# Patient Record
Sex: Female | Born: 1970 | Race: White | Hispanic: No | Marital: Single | Smoking: Former smoker
Health system: Southern US, Community
[De-identification: ages and names within clinical notes are randomized; demographics above are authoritative.]

## PROBLEM LIST (undated history)

## (undated) DIAGNOSIS — I10 Essential (primary) hypertension: Secondary | ICD-10-CM

## (undated) DIAGNOSIS — G2581 Restless legs syndrome: Secondary | ICD-10-CM

## (undated) DIAGNOSIS — T7840XA Allergy, unspecified, initial encounter: Secondary | ICD-10-CM

## (undated) DIAGNOSIS — K219 Gastro-esophageal reflux disease without esophagitis: Secondary | ICD-10-CM

## (undated) DIAGNOSIS — Z8489 Family history of other specified conditions: Secondary | ICD-10-CM

## (undated) DIAGNOSIS — F419 Anxiety disorder, unspecified: Secondary | ICD-10-CM

## (undated) DIAGNOSIS — G43909 Migraine, unspecified, not intractable, without status migrainosus: Secondary | ICD-10-CM

## (undated) DIAGNOSIS — E785 Hyperlipidemia, unspecified: Secondary | ICD-10-CM

## (undated) HISTORY — DX: Allergy, unspecified, initial encounter: T78.40XA

## (undated) HISTORY — DX: Hyperlipidemia, unspecified: E78.5

## (undated) HISTORY — PX: TONSILLECTOMY: SUR1361

## (undated) HISTORY — DX: Gastro-esophageal reflux disease without esophagitis: K21.9

## (undated) HISTORY — PX: TUBAL LIGATION: SHX77

## (undated) HISTORY — PX: HERNIA REPAIR: SHX51

## (undated) HISTORY — DX: Restless legs syndrome: G25.81

## (undated) HISTORY — DX: Anxiety disorder, unspecified: F41.9

---

## 2004-03-18 ENCOUNTER — Emergency Department: Payer: Self-pay | Admitting: Emergency Medicine

## 2004-07-16 ENCOUNTER — Emergency Department: Payer: Self-pay | Admitting: Emergency Medicine

## 2006-08-09 ENCOUNTER — Emergency Department: Payer: Self-pay | Admitting: Emergency Medicine

## 2007-02-27 ENCOUNTER — Other Ambulatory Visit: Payer: Self-pay

## 2007-02-27 ENCOUNTER — Emergency Department: Payer: Self-pay | Admitting: Internal Medicine

## 2007-03-12 ENCOUNTER — Emergency Department: Payer: Self-pay | Admitting: Emergency Medicine

## 2007-06-03 ENCOUNTER — Emergency Department: Payer: Self-pay | Admitting: Emergency Medicine

## 2007-09-05 ENCOUNTER — Other Ambulatory Visit: Payer: Self-pay

## 2007-09-05 ENCOUNTER — Emergency Department: Payer: Self-pay | Admitting: Emergency Medicine

## 2008-08-29 IMAGING — CR RIGHT HAND - COMPLETE 3+ VIEW
1 series · 3 of 3 positions shown · non-contrast
Comparison: none

REASON FOR EXAM: trauma
COMMENTS:

[Series 1: view not recorded · 0.17mm/px · 3 of 3 slices shown]
[im 1/3]
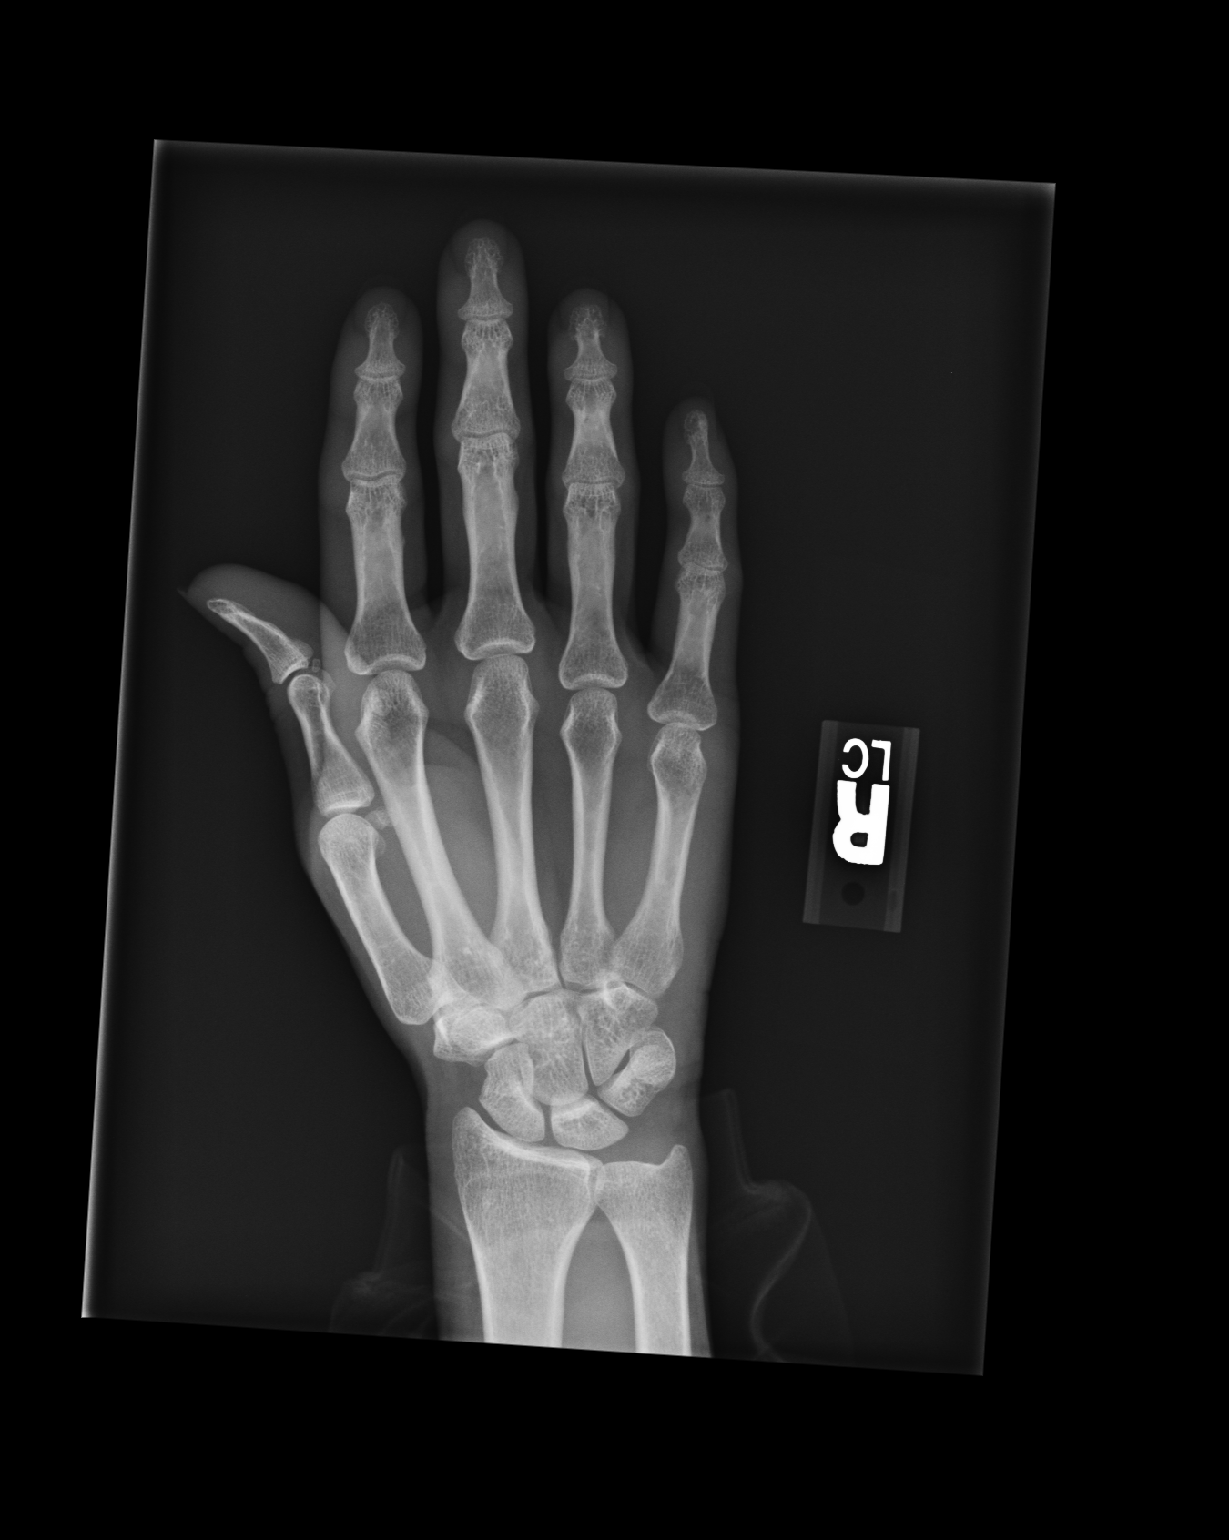
[im 2/3]
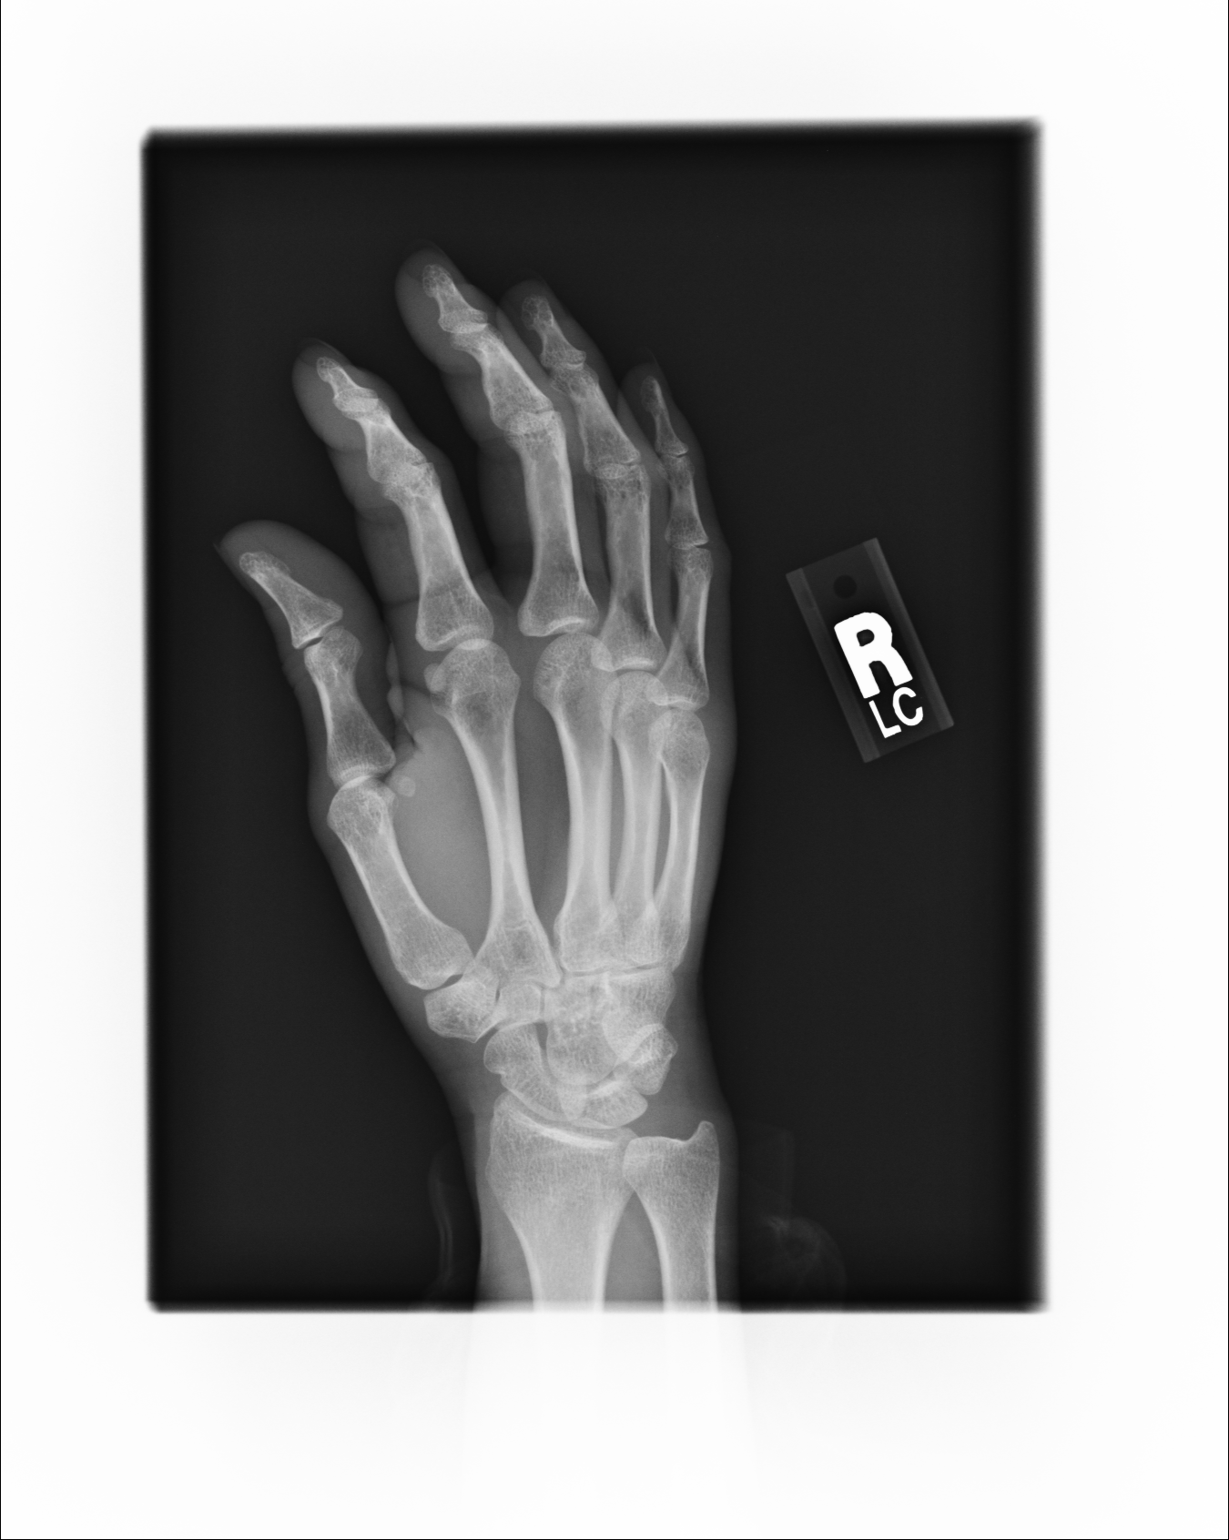
[im 3/3]
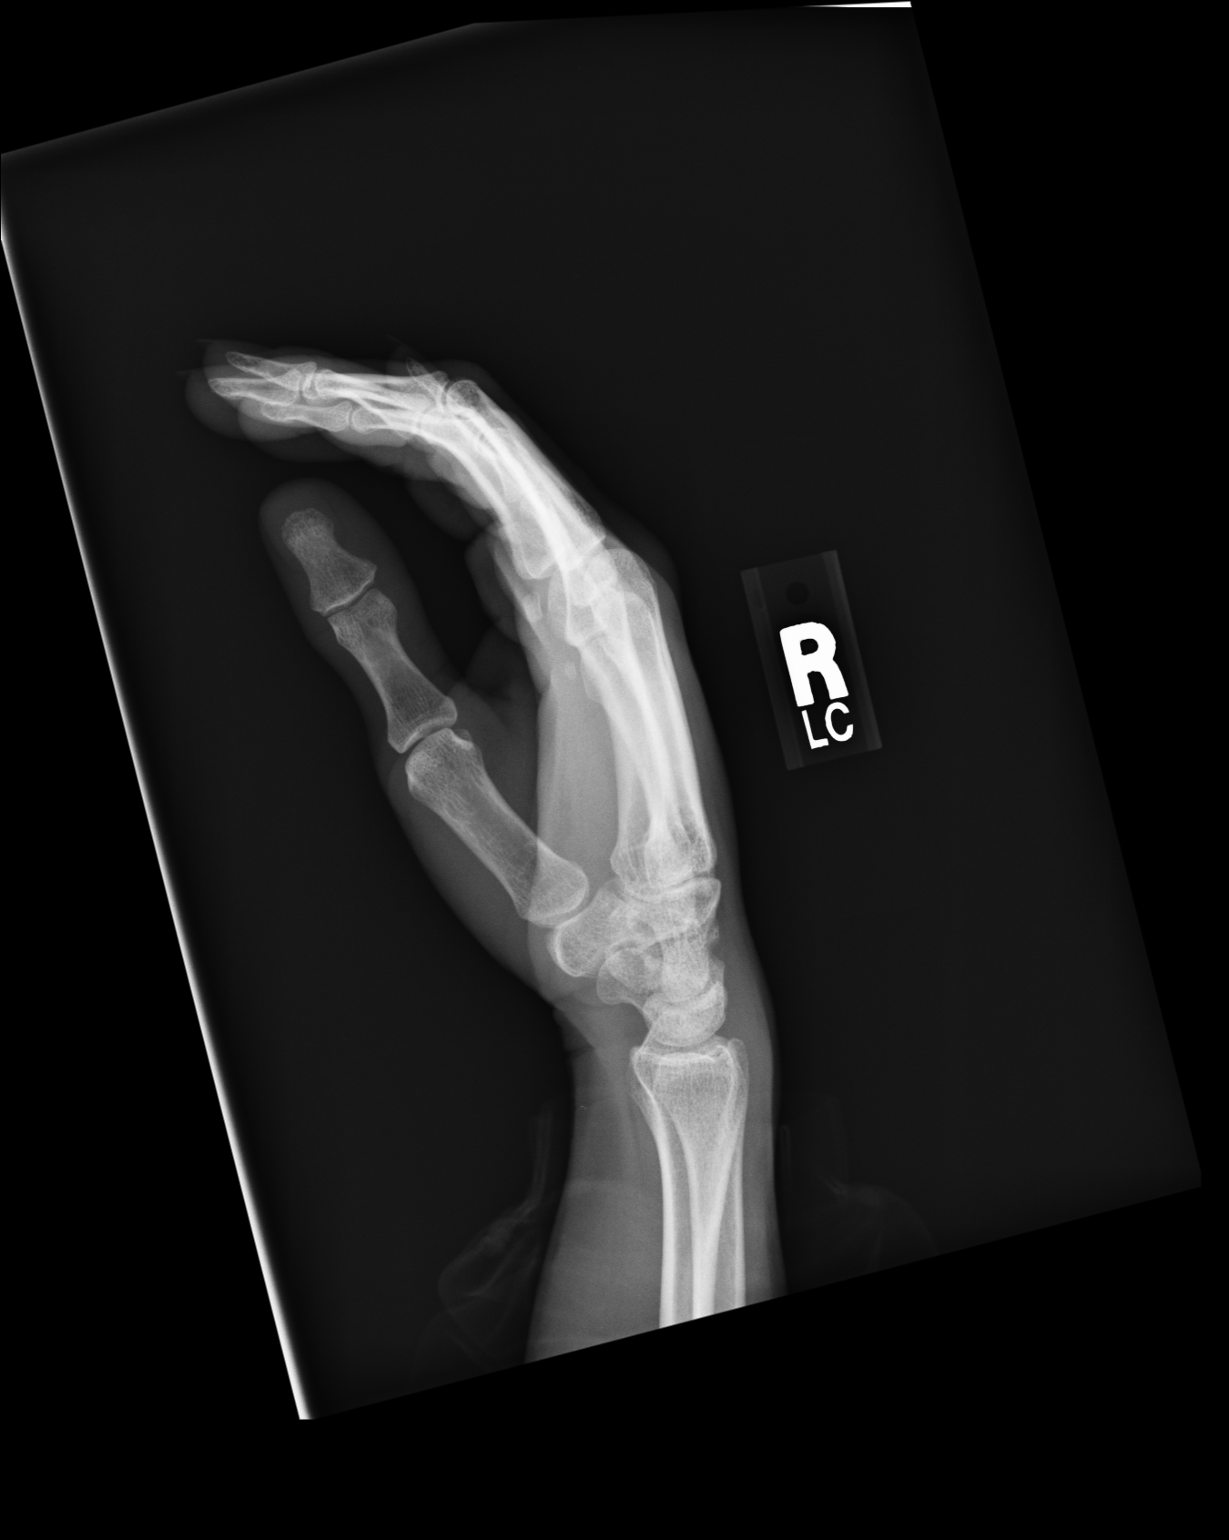

[3 of 3 positions shown; findings below may reference images not displayed]

PROCEDURE:     DXR - DXR HAND RT COMPLETE W/OBLIQUES  - March 12, 2007  [DATE]

RESULT:     The bones of the hand appear adequately mineralized. There is no
evidence of an acute displaced fracture. Clinical note was made of
abnormality suspected in the region of the fourth metacarpophalangeal joint.
I do not see definite evidence of acute bony abnormality here. There may be
overlying soft tissue swelling.
IMPRESSION: I do not see objective evidence of an acute fracture of the
bones of the RIGHT hand. Specific attention to the fourth
metacarpophalangeal joint does not reveal evidence of an acute displaced
fracture. Follow-up imaging is available if the patient has persistent
symptoms.

## 2009-09-03 ENCOUNTER — Emergency Department: Payer: Self-pay | Admitting: Unknown Physician Specialty

## 2009-12-22 ENCOUNTER — Emergency Department: Payer: Self-pay | Admitting: Emergency Medicine

## 2010-04-15 ENCOUNTER — Ambulatory Visit: Payer: Self-pay | Admitting: Family Medicine

## 2011-12-17 ENCOUNTER — Ambulatory Visit: Payer: Self-pay | Admitting: Family Medicine

## 2012-01-28 ENCOUNTER — Ambulatory Visit: Payer: Self-pay | Admitting: Family Medicine

## 2012-01-28 LAB — CBC WITH DIFFERENTIAL/PLATELET
Basophil %: 1.4 %
Eosinophil %: 3.4 %
HGB: 14.1 g/dL (ref 12.0–16.0)
Lymphocyte %: 31.2 %
Monocyte %: 8.1 %
Neutrophil %: 55.9 %
RBC: 4.79 10*6/uL (ref 3.80–5.20)
WBC: 5.3 10*3/uL (ref 3.6–11.0)

## 2012-08-30 ENCOUNTER — Emergency Department: Payer: Self-pay | Admitting: Emergency Medicine

## 2012-12-10 ENCOUNTER — Emergency Department: Payer: Self-pay | Admitting: Emergency Medicine

## 2014-11-15 DIAGNOSIS — G43009 Migraine without aura, not intractable, without status migrainosus: Secondary | ICD-10-CM | POA: Insufficient documentation

## 2015-07-19 DIAGNOSIS — F410 Panic disorder [episodic paroxysmal anxiety] without agoraphobia: Secondary | ICD-10-CM

## 2015-07-19 HISTORY — DX: Panic disorder (episodic paroxysmal anxiety): F41.0

## 2015-07-31 ENCOUNTER — Other Ambulatory Visit: Payer: Self-pay | Admitting: Family Medicine

## 2015-07-31 DIAGNOSIS — Z1231 Encounter for screening mammogram for malignant neoplasm of breast: Secondary | ICD-10-CM

## 2015-08-02 ENCOUNTER — Ambulatory Visit
Admission: RE | Admit: 2015-08-02 | Discharge: 2015-08-02 | Disposition: A | Discharge status: Home / Self Care | Payer: BLUE CROSS/BLUE SHIELD | Source: Ambulatory Visit | Attending: Family Medicine | Admitting: Family Medicine

## 2015-08-02 DIAGNOSIS — Z1231 Encounter for screening mammogram for malignant neoplasm of breast: Secondary | ICD-10-CM | POA: Diagnosis not present

## 2015-08-14 ENCOUNTER — Other Ambulatory Visit: Payer: Self-pay | Admitting: Family Medicine

## 2015-08-14 DIAGNOSIS — R928 Other abnormal and inconclusive findings on diagnostic imaging of breast: Secondary | ICD-10-CM

## 2015-08-17 ENCOUNTER — Ambulatory Visit
Admission: RE | Admit: 2015-08-17 | Discharge: 2015-08-17 | Disposition: A | Discharge status: Home / Self Care | Payer: BLUE CROSS/BLUE SHIELD | Source: Ambulatory Visit | Attending: Family Medicine | Admitting: Family Medicine

## 2015-08-17 DIAGNOSIS — N6002 Solitary cyst of left breast: Secondary | ICD-10-CM | POA: Insufficient documentation

## 2015-08-17 DIAGNOSIS — R928 Other abnormal and inconclusive findings on diagnostic imaging of breast: Secondary | ICD-10-CM

## 2015-08-20 LAB — HM PAP SMEAR

## 2016-08-22 ENCOUNTER — Ambulatory Visit (INDEPENDENT_AMBULATORY_CARE_PROVIDER_SITE_OTHER): Payer: BLUE CROSS/BLUE SHIELD

## 2016-08-22 ENCOUNTER — Encounter: Payer: Self-pay | Admitting: *Deleted

## 2016-08-22 ENCOUNTER — Ambulatory Visit
Admission: EM | Admit: 2016-08-22 | Discharge: 2016-08-22 | Disposition: A | Discharge status: Home / Self Care | Payer: BLUE CROSS/BLUE SHIELD | Attending: Emergency Medicine | Admitting: Emergency Medicine

## 2016-08-22 DIAGNOSIS — R05 Cough: Secondary | ICD-10-CM

## 2016-08-22 DIAGNOSIS — R059 Cough, unspecified: Secondary | ICD-10-CM

## 2016-08-22 LAB — BRAIN NATRIURETIC PEPTIDE: B NATRIURETIC PEPTIDE 5: 7 pg/mL (ref 0.0–100.0)

## 2016-08-22 LAB — BASIC METABOLIC PANEL
ANION GAP: 7 (ref 5–15)
BUN: 13 mg/dL (ref 6–20)
CALCIUM: 9.1 mg/dL (ref 8.9–10.3)
CO2: 23 mmol/L (ref 22–32)
Chloride: 104 mmol/L (ref 101–111)
Creatinine, Ser: 0.57 mg/dL (ref 0.44–1.00)
GLUCOSE: 102 mg/dL — AB (ref 65–99)
Potassium: 3.9 mmol/L (ref 3.5–5.1)
Sodium: 134 mmol/L — ABNORMAL LOW (ref 135–145)

## 2016-08-22 MED ORDER — BENZONATATE 200 MG PO CAPS: 200.0000 mg | ORAL_CAPSULE | Freq: Three times a day (TID) | ORAL | 0 refills | Status: DC | PRN

## 2016-08-22 MED ORDER — AEROCHAMBER PLUS FLO-VU MEDIUM MISC
1.0000 | Freq: Once | Status: AC
  Administered 2016-08-22: 1

## 2016-08-22 NOTE — Discharge Instructions (Signed)
2 puffs from your albuterol inhaler every 4-6 hours using your spacer. Tessalon as needed for cough. Give The antibiotics and steroids a few more days to work. Follow-up with her primary care physician if not getting better in several days, go to the ER for the signs and symptoms we discussed.

## 2016-08-22 NOTE — ED Triage Notes (Signed)
Patient started having symptom of productive cough, and nasal congestion 1.5 months ago. SOB on exertion.

## 2016-08-22 NOTE — ED Provider Notes (Signed)
HPI  SUBJECTIVE:  Cristina Aguilar is a 46 y.o. female who presents with "bronchitis" for the past month and a half. She reports coughing, wheezing, shortness breath, dyspnea on exertion to several hundred feet. She states that she can't sleep at night secondary to the cough. She reports chest soreness from the cough but denies chest pain. She was seen at another urgent care, given azithromycin and then switched to 10-15 days of Levaquin which she states that she finished yesterday. She also states that she finished a prednisone taper. States that she was using her albuterol inhaler 4-5 times a day without improvement in her symptoms. She does not have a spacer. She has also tried over-the-counter cough syrup, cough drops and allergy medications without improvement in her symptoms. No alleviating factors. Symptoms are worse with movement and talking. She denies posttussive emesis, fevers, unintentional weight loss. No nasal congestion, rhinorrhea, postnasal drip, sinus pain or pressure. No unintentional weight gain, lower extremity edema, nocturia, PND, abdominal pain. She does report 2 pillow orthopnea for the during this time. No GERD for allergy type symptoms. No antipyretic in the past 6-8 hours. She has a 7 1/2 year history of smoking and quit. She has a past medical history sinusitis. No history of asthma, emphysema, COPD, CHF, diabetes, hypertension, GERD. LMP: 5/29. Denies possibility of being pregnant. PMD: [facility].   History reviewed. No pertinent past medical history.  Past Surgical History:  Procedure Laterality Date  . TONSILLECTOMY    . TUBAL LIGATION      Family History  Problem Relation Age of Onset  . Breast cancer Maternal Grandmother        mat great gm    Social History  Substance Use Topics  . Smoking status: Former Smoker    Quit date: 08/23/2006  . Smokeless tobacco: Never Used  . Alcohol use No    No current facility-administered medications for this  encounter.   Current Outpatient Prescriptions:  .  LevoFLOXacin (LEVAQUIN PO), Take by mouth., Disp: , Rfl:  .  predniSONE (STERAPRED UNI-PAK 21 TAB) 10 MG (21) TBPK tablet, Take 10 mg by mouth daily., Disp: , Rfl:  .  benzonatate (TESSALON) 200 MG capsule, Take 1 capsule (200 mg total) by mouth 3 (three) times daily as needed for cough., Disp: 30 capsule, Rfl: 0  Allergies  Allergen Reactions  . Codeine Shortness Of Breath  . Morphine And Related Other (See Comments)    Seizures   . Sulfa Antibiotics Rash     ROS  As noted in HPI.   Physical Exam  BP 122/85 (BP Location: Left Arm)   Pulse 62   Temp 98.1 F (36.7 C) (Oral)   Resp 16   Ht 5\' 1"  (1.549 m)   Wt 123 lb (55.8 kg)   LMP 08/05/2016   SpO2 98%   BMI 23.24 kg/m   Constitutional: Well developed, well nourished, no acute distress Eyes: PERRL, EOMI, conjunctiva normal bilaterally HENT: Normocephalic, atraumatic,mucus membranes moist. No nasal congestion, sinus tenderness. Tonsils surgically absent. No obvious postnasal drip Respiratory: Clear to auscultation bilaterally, no rales, no wheezing, no rhonchi no chest wall tenderness Cardiovascular: Normal rate and rhythm, no murmurs, no gallops, no rubs. No JVD GI: nondistended skin: No rash, skin intact Musculoskeletal: Calves symmetric, No edema, no tenderness, no deformities Neurologic: Alert & oriented x 3, CN II-XII grossly intact, no motor deficits, sensation grossly intact Psychiatric: Speech and behavior appropriate   ED Course   Medications  AEROCHAMBER  PLUS FLO-VU MEDIUM MISC 1 each (1 each Other Given 08/22/16 1243)    Orders Placed This Encounter  Procedures  . DG Chest 2 View    Standing Status:   Standing    Number of Occurrences:   1    Order Specific Question:   Reason for Exam (SYMPTOM  OR DIAGNOSIS REQUIRED)    Answer:   r/o PNA, CHF, effusion, CA  . Brain natriuretic peptide    Standing Status:   Standing    Number of Occurrences:   1   . Basic metabolic panel    Standing Status:   Standing    Number of Occurrences:   1   Results for orders placed or performed during the hospital encounter of 08/22/16 (from the past 24 hour(s))  Brain natriuretic peptide     Status: None   Collection Time: 08/22/16 12:29 PM  Result Value Ref Range   B Natriuretic Peptide 7.0 0.0 - 100.0 pg/mL  Basic metabolic panel     Status: Abnormal   Collection Time: 08/22/16 12:29 PM  Result Value Ref Range   Sodium 134 (L) 135 - 145 mmol/L   Potassium 3.9 3.5 - 5.1 mmol/L   Chloride 104 101 - 111 mmol/L   CO2 23 22 - 32 mmol/L   Glucose, Bld 102 (H) 65 - 99 mg/dL   BUN 13 6 - 20 mg/dL   Creatinine, Ser 1.610.57 0.44 - 1.00 mg/dL   Calcium 9.1 8.9 - 09.610.3 mg/dL   GFR calc non Af Amer >60 >60 mL/min   GFR calc Af Amer >60 >60 mL/min   Anion gap 7 5 - 15   Dg Chest 2 View  Result Date: 08/22/2016 CLINICAL DATA:  Several months of cough, 1 month of shortness of breath. EXAM: CHEST  2 VIEW COMPARISON:  Chest x-ray of January 28, 2012 FINDINGS: The lungs are well-expanded and clear. The heart and pulmonary vascularity are normal. The mediastinum is normal in width. There is no pleural effusion. The bony thorax is unremarkable. IMPRESSION: There is no pneumonia, CHF, nor other acute cardiopulmonary abnormality. Electronically Signed   By: David  SwazilandJordan M.D.   On: 08/22/2016 11:58    ED Clinical Impression  Cough   ED Assessment/Plan  Attempted to review care everywhere notes, unable to see notes, the patient was seen in November 2017 for cough and shortness of breath by her PMD.  We'll repeat chest x-ray, and give patient a spacer. We'll also check a BNP. Home with Tessalon as she reports significant allergies to hydrocodone, morphine and codeine.   Chest x-ray independently reviewed. No effusion, pneumonia, CHF or other acute cardiopulmonary abnormality. See radiology report for details.  BNP negative. BMP normal.   no evidence of  sinusitis. She is not on any Ace inhibitors.   GERD in the differential. We'll have the patient continue albuterol and a regular basis, states that she does not need a prescription for another inhaler, but using her spacer. Advised her to give this several more days. She will need follow-up with her primary care physician if not getting better. To the ER if she gets worse. Discussed labs, imaging, MDM, plan and followup with patient. Discussed sn/sx that should prompt return to the ED. Patient agrees with plan.   Meds ordered this encounter  Medications  . LevoFLOXacin (LEVAQUIN PO)    Sig: Take by mouth.  . predniSONE (STERAPRED UNI-PAK 21 TAB) 10 MG (21) TBPK tablet    Sig: Take 10  mg by mouth daily.  . AEROCHAMBER PLUS FLO-VU MEDIUM MISC 1 each  . benzonatate (TESSALON) 200 MG capsule    Sig: Take 1 capsule (200 mg total) by mouth 3 (three) times daily as needed for cough.    Dispense:  30 capsule    Refill:  0    *This clinic note was created using Scientist, clinical (histocompatibility and immunogenetics). Therefore, there may be occasional mistakes despite careful proofreading.  ?   Domenick Gong, MD 08/22/16 [phone]

## 2017-02-05 ENCOUNTER — Emergency Department (HOSPITAL_COMMUNITY): Payer: BLUE CROSS/BLUE SHIELD

## 2017-02-05 ENCOUNTER — Emergency Department (HOSPITAL_COMMUNITY)
Admission: EM | Admit: 2017-02-05 | Discharge: 2017-02-06 | Disposition: A | Discharge status: Home / Self Care | Payer: BLUE CROSS/BLUE SHIELD | Attending: Emergency Medicine | Admitting: Emergency Medicine

## 2017-02-05 ENCOUNTER — Other Ambulatory Visit: Payer: Self-pay

## 2017-02-05 ENCOUNTER — Encounter (HOSPITAL_COMMUNITY): Payer: Self-pay | Admitting: Emergency Medicine

## 2017-02-05 DIAGNOSIS — R079 Chest pain, unspecified: Secondary | ICD-10-CM | POA: Diagnosis present

## 2017-02-05 DIAGNOSIS — M549 Dorsalgia, unspecified: Secondary | ICD-10-CM | POA: Insufficient documentation

## 2017-02-05 DIAGNOSIS — Z87891 Personal history of nicotine dependence: Secondary | ICD-10-CM | POA: Diagnosis not present

## 2017-02-05 DIAGNOSIS — R0789 Other chest pain: Secondary | ICD-10-CM

## 2017-02-05 LAB — BASIC METABOLIC PANEL
Anion gap: 6 (ref 5–15)
BUN: 14 mg/dL (ref 6–20)
CALCIUM: 9.2 mg/dL (ref 8.9–10.3)
CO2: 24 mmol/L (ref 22–32)
Chloride: 107 mmol/L (ref 101–111)
Creatinine, Ser: 0.6 mg/dL (ref 0.44–1.00)
GFR calc Af Amer: >60 mL/min (ref >60)
GLUCOSE: 111 mg/dL — AB (ref 65–99)
POTASSIUM: 3.3 mmol/L — AB (ref 3.5–5.1)
Sodium: 137 mmol/L (ref 135–145)

## 2017-02-05 LAB — CBC
HEMATOCRIT: 41.5 % (ref 36.0–46.0)
Hemoglobin: 13.6 g/dL (ref 12.0–15.0)
MCH: 28.8 pg (ref 26.0–34.0)
MCHC: 32.8 g/dL (ref 30.0–36.0)
MCV: 87.7 fL (ref 78.0–100.0)
Platelets: 241 10*3/uL (ref 150–400)
RBC: 4.73 MIL/uL (ref 3.87–5.11)
RDW: 13.2 % (ref 11.5–15.5)
WBC: 6.8 10*3/uL (ref 4.0–10.5)

## 2017-02-05 LAB — I-STAT TROPONIN, ED: Troponin i, poc: 0 ng/mL (ref 0.00–0.08)

## 2017-02-05 MED ORDER — GI COCKTAIL ~~LOC~~
30.0000 mL | Freq: Once | ORAL | Status: AC
  Administered 2017-02-05: 30 mL via ORAL
  Filled 2017-02-05: qty 30

## 2017-02-05 NOTE — ED Notes (Addendum)
I-Stat Troponin: 0.00 

## 2017-02-05 NOTE — ED Triage Notes (Signed)
Cp in center of chest on and off since last night.  Has been worse tonight

## 2017-02-06 LAB — HEPATIC FUNCTION PANEL
ALBUMIN: 4.1 g/dL (ref 3.5–5.0)
ALT: 20 U/L (ref 14–54)
AST: 21 U/L (ref 15–41)
Alkaline Phosphatase: 58 U/L (ref 38–126)
Bilirubin, Direct: <0.1 mg/dL — ABNORMAL LOW (ref 0.1–0.5)
TOTAL PROTEIN: 6.9 g/dL (ref 6.5–8.1)
Total Bilirubin: 0.5 mg/dL (ref 0.3–1.2)

## 2017-02-06 LAB — LIPASE, BLOOD: LIPASE: 46 U/L (ref 11–51)

## 2017-02-06 MED ORDER — PANTOPRAZOLE SODIUM 20 MG PO TBEC: 20.0000 mg | DELAYED_RELEASE_TABLET | Freq: Two times a day (BID) | ORAL | 0 refills | Status: DC

## 2017-02-06 MED ORDER — NAPROXEN 375 MG PO TABS: 375.0000 mg | ORAL_TABLET | Freq: Two times a day (BID) | ORAL | 0 refills | Status: DC

## 2017-02-06 NOTE — Discharge Instructions (Signed)
Your exam, labs, ekg and chest xray are negative tonight with no sign of your symptoms being from your heart or lungs. The location and radiation into your back and the timing of your symptoms suggests possible esophageal spasms, or chest wall pain source (ribcage pain).  Given you obtained some relief with the GI cocktail we gave you, you have been prescribed an antacid medicine and an anti inflammatory to see if your symptoms will resolve completely. Plan a recheck by your doctor in one week for a recheck.

## 2017-02-06 NOTE — ED Provider Notes (Signed)
[facility] EMERGENCY DEPARTMENT Provider Note   CSN: [id number] Arrival date & time: 02/05/17  2225     History   Chief Complaint Chief Complaint  Patient presents with  . Chest Pain    HPI Cristina Aguilar is a 46 y.o. female with no significant past medical history and no family history of significant coronary disease presenting with a 24 hour history of intermittent sharp, stabbing pain in her left upper chest, lasting up to one minute, multiple episodes throughout the day.  She reports episodes of radiation of pain into her left upper back but also episodes in which she only feels it in the back as well.  She denies sob, n/v/ abdominal pain, palpitations, cough or recent respiratory illness and denies dizziness or diaphoresis.  She took a dose of ibuprofen last night without improvement. Denies any recent long car rides/plane trips or other periods of sedation, stating she is very active, works in a Retail buyer setting position, denies any obvious triggers for these episodes including exertion, eating or positional changes.  The history is provided by the patient.    History reviewed. No pertinent past medical history.  There are no active problems to display for this patient.   Past Surgical History:  Procedure Laterality Date  . TONSILLECTOMY    . TUBAL LIGATION      OB History    No data available       Home Medications    Prior to Admission medications   Medication Sig Start Date End Date Taking? Authorizing Provider  benzonatate (TESSALON) 200 MG capsule Take 1 capsule (200 mg total) by mouth 3 (three) times daily as needed for cough. 08/22/16   Domenick Gong, MD  LevoFLOXacin (LEVAQUIN PO) Take by mouth.    [provider]  naproxen (NAPROSYN) 375 MG tablet Take 1 tablet (375 mg total) by mouth 2 (two) times daily. 02/06/17   Burgess Amor, PA-C  pantoprazole (PROTONIX) 20 MG tablet Take 1 tablet (20 mg total) by mouth 2 (two) times daily.  02/06/17   Burgess Amor, PA-C  predniSONE (STERAPRED UNI-PAK 21 TAB) 10 MG (21) TBPK tablet Take 10 mg by mouth daily.    [provider]    Family History Family History  Problem Relation Age of Onset  . Breast cancer Maternal Grandmother        mat great gm    Social History Social History   Tobacco Use  . Smoking status: Former Smoker    Last attempt to quit: 08/23/2006    Years since quitting: 10.4  . Smokeless tobacco: Never Used  Substance Use Topics  . Alcohol use: No  . Drug use: Not on file     Allergies   Codeine; Morphine and related; and Sulfa antibiotics   Review of Systems Review of Systems  Constitutional: Negative for fever.  HENT: Negative for congestion and sore throat.   Eyes: Negative.   Respiratory: Negative for cough, chest tightness, shortness of breath, wheezing and stridor.   Cardiovascular: Positive for chest pain. Negative for palpitations and leg swelling.  Gastrointestinal: Negative for abdominal pain, nausea and vomiting.  Genitourinary: Negative.   Musculoskeletal: Negative for arthralgias, joint swelling and neck pain.  Skin: Negative.  Negative for rash and wound.  Neurological: Negative for dizziness, weakness, light-headedness, numbness and headaches.  Psychiatric/Behavioral: Negative.      Physical Exam Updated Vital Signs BP 140/65   Pulse (!) 56   Temp 98 F (36.7 C) (Oral)  Resp 18   Ht 5\' 1"  (1.549 m)   Wt 60.3 kg (133 lb)   LMP 01/17/2017   SpO2 99%   BMI 25.13 kg/m   Physical Exam  Constitutional: She appears well-developed and well-nourished.  HENT:  Head: Normocephalic and atraumatic.  Eyes: Conjunctivae are normal.  Neck: Normal range of motion.  Cardiovascular: Normal rate, regular rhythm, normal heart sounds and intact distal pulses. Exam reveals no friction rub.  No murmur heard.  No systolic murmur is present. Pulses:      Carotid pulses are 2+ on the right side, and 2+ on the left side.       Radial pulses are 2+ on the right side, and 2+ on the left side.       Dorsalis pedis pulses are 2+ on the right side, and 2+ on the left side.  Pulmonary/Chest: Effort normal and breath sounds normal. She has no decreased breath sounds. She has no wheezes. She has no rhonchi. She has no rales.  Abdominal: Soft. Bowel sounds are normal. There is no tenderness.  Musculoskeletal: Normal range of motion.       Right lower leg: Normal. She exhibits no tenderness and no edema.       Left lower leg: Normal. She exhibits no tenderness and no edema.  Neurological: She is alert.  Skin: Skin is warm and dry.  Psychiatric: She has a normal mood and affect.  Nursing note and vitals reviewed.    ED Treatments / Results  Labs (all labs ordered are listed, but only abnormal results are displayed) Labs Reviewed  BASIC METABOLIC PANEL - Abnormal; Notable for the following components:      Result Value   Potassium 3.3 (*)    Glucose, Bld 111 (*)    All other components within normal limits  HEPATIC FUNCTION PANEL - Abnormal; Notable for the following components:   Bilirubin, Direct <0.1 (*)    All other components within normal limits  CBC  LIPASE, BLOOD  I-STAT TROPONIN, ED    EKG  EKG Interpretation  Date/Time:  Thursday February 05 2017 22:35:17 EST Ventricular Rate:  66 PR Interval:    QRS Duration: 83 QT Interval:  386 QTC Calculation: 405 R Axis:   74 Text Interpretation:  Sinus rhythm Normal ECG No significant change since last tracing 05 Sep 2007 Confirmed by Devoria AlbeKnapp, Iva ([id number]) on 02/06/2017 2:43:07 AM       Radiology Dg Chest 2 View  Result Date: 02/05/2017 CLINICAL DATA:  Left-sided chest pain, onset last night. EXAM: CHEST  2 VIEW COMPARISON:  08/22/2016 FINDINGS: The cardiomediastinal contours are normal. Minimal right apical scarring, unchanged. Pulmonary vasculature is normal. No consolidation, pleural effusion, or pneumothorax. No acute osseous abnormalities are seen.  IMPRESSION: No acute pulmonary process. Electronically Signed   By: Rubye OaksMelanie  Ehinger M.D.   On: 02/05/2017 23:13    Procedures Procedures (including critical care time)  Medications Ordered in ED Medications  gi cocktail (Maalox,Lidocaine,Donnatal) (30 mLs Oral Given 02/05/17 2349)     Initial Impression / Assessment and Plan / ED Course  I have reviewed the triage vital signs and the nursing notes.  Pertinent labs & imaging results that were available during my care of the patient were reviewed by me and considered in my medical decision making (see chart for details).     Pt with no personal or family risk factors for CAD with atypical transient episodes of chest and back pain. Troponin x 1 neg 24 hours  out from initiation of sx. No risk factors for PE, no sob, no tachypnea, tachycardia. Pt meets no Wells criteria for risk of PE.  She was given a GI cocktail and endorsed her anterior chest pain episodes resolved after this med, has had a few episodes of back pain but less intense after this tx.  Suspect GERD and/or esophageal spasm as source of sx.  PPI, anti inflammatory prescribed to cover for possible chest wall pain as well. Advised f/u with pcp for a recheck within a week, sooner for any worsened sx.  Final Clinical Impressions(s) / ED Diagnoses   Final diagnoses:  Atypical chest pain    ED Discharge Orders        Ordered    pantoprazole (PROTONIX) 20 MG tablet  2 times daily     02/06/17 0148    naproxen (NAPROSYN) 375 MG tablet  2 times daily     02/06/17 0148       Burgess Amordol, Doranne Schmutz, PA-C 02/06/17 1329    Devoria AlbeKnapp, Iva, MD 02/06/17 2258

## 2017-02-26 ENCOUNTER — Other Ambulatory Visit: Payer: Self-pay | Admitting: Family Medicine

## 2017-02-26 DIAGNOSIS — Z1231 Encounter for screening mammogram for malignant neoplasm of breast: Secondary | ICD-10-CM

## 2017-10-02 ENCOUNTER — Ambulatory Visit
Admission: RE | Admit: 2017-10-02 | Discharge: 2017-10-02 | Disposition: A | Discharge status: Home / Self Care | Payer: BLUE CROSS/BLUE SHIELD | Source: Ambulatory Visit | Attending: Family Medicine | Admitting: Family Medicine

## 2017-10-02 DIAGNOSIS — Z1231 Encounter for screening mammogram for malignant neoplasm of breast: Secondary | ICD-10-CM | POA: Diagnosis present

## 2018-08-30 ENCOUNTER — Other Ambulatory Visit: Payer: Self-pay | Admitting: Neurology

## 2018-08-30 DIAGNOSIS — R42 Dizziness and giddiness: Secondary | ICD-10-CM

## 2018-09-15 ENCOUNTER — Other Ambulatory Visit: Payer: Self-pay

## 2018-09-15 ENCOUNTER — Ambulatory Visit
Admission: RE | Admit: 2018-09-15 | Discharge: 2018-09-15 | Disposition: A | Discharge status: Home / Self Care | Payer: BC Managed Care – PPO | Source: Ambulatory Visit | Attending: Neurology | Admitting: Neurology

## 2018-09-15 DIAGNOSIS — R42 Dizziness and giddiness: Secondary | ICD-10-CM | POA: Insufficient documentation

## 2018-09-15 MED ORDER — GADOBUTROL 1 MMOL/ML IV SOLN
6.0000 mL | Freq: Once | INTRAVENOUS | Status: AC | PRN
  Administered 2018-09-15: 17:00:00 6 mL via INTRAVENOUS

## 2019-09-08 LAB — TSH: TSH: 1.98 (ref 0.41–5.90)

## 2019-09-08 LAB — HM HEPATITIS C SCREENING LAB: HM Hepatitis Screen: NEGATIVE

## 2019-09-14 ENCOUNTER — Other Ambulatory Visit: Payer: Self-pay | Admitting: Medical Oncology

## 2019-09-14 DIAGNOSIS — Z1231 Encounter for screening mammogram for malignant neoplasm of breast: Secondary | ICD-10-CM

## 2019-09-22 ENCOUNTER — Inpatient Hospital Stay: Admission: RE | Admit: 2019-09-22 | Payer: BC Managed Care – PPO | Source: Ambulatory Visit

## 2020-01-12 ENCOUNTER — Encounter (HOSPITAL_COMMUNITY): Payer: Self-pay | Admitting: Emergency Medicine

## 2020-01-12 ENCOUNTER — Emergency Department (HOSPITAL_COMMUNITY): Payer: BC Managed Care – PPO

## 2020-01-12 ENCOUNTER — Other Ambulatory Visit: Payer: Self-pay

## 2020-01-12 ENCOUNTER — Emergency Department (HOSPITAL_COMMUNITY)
Admission: EM | Admit: 2020-01-12 | Discharge: 2020-01-12 | Disposition: A | Discharge status: Home / Self Care | Payer: BC Managed Care – PPO | Attending: Emergency Medicine | Admitting: Emergency Medicine

## 2020-01-12 DIAGNOSIS — Z9851 Tubal ligation status: Secondary | ICD-10-CM | POA: Diagnosis not present

## 2020-01-12 DIAGNOSIS — M79662 Pain in left lower leg: Secondary | ICD-10-CM

## 2020-01-12 DIAGNOSIS — Z87891 Personal history of nicotine dependence: Secondary | ICD-10-CM | POA: Insufficient documentation

## 2020-01-12 NOTE — ED Provider Notes (Signed)
[facility] EMERGENCY DEPARTMENT Provider Note   CSN: [id number] Arrival date & time: 01/12/20  1506     History Chief Complaint  Patient presents with  . Leg Pain    Cristina Aguilar is a 49 y.o. female who presents for evaluation of left lower extremity pain that began last night while at work.  Patient reports she was at work when she started noticing some pain in her left calf.  She denies any preceding trauma, injury, fall.  She has been able to ambulate on it.  She reports that today, she went to urgent care and was sent to the ED for further evaluation of possible DVT.  Patient states she has not noted any redness or swelling of the leg. Her pain has actually since resolved and today states that over the last few hours she has not had any pain.  She has not any numbness/weakness of her extremities. She denies any OCP use, recent immobilization, prior history of DVT/PE, recent surgery, leg swelling, or long travel.  The history is provided by the patient.       History reviewed. No pertinent past medical history.  There are no problems to display for this patient.   Past Surgical History:  Procedure Laterality Date  . TONSILLECTOMY    . TUBAL LIGATION       OB History   No obstetric history on file.     Family History  Problem Relation Age of Onset  . Breast cancer Maternal Grandmother        mat great gm    Social History   Tobacco Use  . Smoking status: Former Smoker    Quit date: 08/23/2006    Years since quitting: 13.3  . Smokeless tobacco: Never Used  Substance Use Topics  . Alcohol use: No  . Drug use: Not on file    Home Medications Prior to Admission medications   Medication Sig Start Date End Date Taking? Authorizing Provider  benzonatate (TESSALON) 200 MG capsule Take 1 capsule (200 mg total) by mouth 3 (three) times daily as needed for cough. 08/22/16   Domenick Gong, MD  LevoFLOXacin (LEVAQUIN PO) Take by mouth.    [provider]  naproxen (NAPROSYN) 375 MG tablet Take 1 tablet (375 mg total) by mouth 2 (two) times daily. 02/06/17   Burgess Amor, PA-C  pantoprazole (PROTONIX) 20 MG tablet Take 1 tablet (20 mg total) by mouth 2 (two) times daily. 02/06/17   Burgess Amor, PA-C  predniSONE (STERAPRED UNI-PAK 21 TAB) 10 MG (21) TBPK tablet Take 10 mg by mouth daily.    [provider]    Allergies    Codeine, Morphine and related, and Sulfa antibiotics  Review of Systems   Review of Systems  Constitutional: Negative for fever.  Musculoskeletal:       Left calf pain  Skin: Negative for color change.  Neurological: Negative for weakness and numbness.  All other systems reviewed and are negative.   Physical Exam Updated Vital Signs BP (!) 135/96 (BP Location: Right Arm)   Pulse 80   Temp 97.7 F (36.5 C) (Oral)   Resp 18   LMP 12/18/2019   SpO2 98%   Physical Exam Vitals and nursing note reviewed.  Constitutional:      Appearance: She is well-developed.  HENT:     Head: Normocephalic and atraumatic.  Eyes:     General: No scleral icterus.       Right eye: No discharge.  Left eye: No discharge.     Conjunctiva/sclera: Conjunctivae normal.  Cardiovascular:     Pulses:          Dorsalis pedis pulses are 2+ on the right side and 2+ on the left side.  Pulmonary:     Effort: Pulmonary effort is normal.  Musculoskeletal:     Comments: Bilateral lower extremities are symmetric in appearance bony overlying warmth, erythema, edema.  No tenderness palpation noted to left calf.  Full dorsiflexion and plantarflexion of left foot intact by difficulty.  She can wiggle all 5 toes.  Skin:    General: Skin is warm and dry.     Comments: Good distal cap refill. LLE is not dusky in appearance or cool to touch.  Neurological:     Mental Status: She is alert.     Comments: Sensation intact along major nerve distributions of BLE  Psychiatric:        Speech: Speech normal.        Behavior: Behavior  normal.     ED Results / Procedures / Treatments   Labs (all labs ordered are listed, but only abnormal results are displayed) Labs Reviewed - No data to display  EKG None  Radiology US Venous Img Lower  Left (DVT Study)  Result Date: 01/12/2020 CLINICAL DATA:  Left lower extremity edema EXAM: LEFT LOWER EXTREMITY VENOUS DOPPLER ULTRASOUND TECHNIQUE: Gray-scale sonography with compression, as well as color and duplex ultrasound, were performed to evaluate the deep venous system(s) from the level of the common femoral vein through the popliteal and proximal calf veins. COMPARISON:  None. FINDINGS: VENOUS Normal compressibility of the common femoral, superficial femoral, and popliteal veins, as well as the visualized calf veins. Visualized portions of profunda femoral vein and great saphenous vein unremarkable. No filling defects to suggest DVT on grayscale or color Doppler imaging. Doppler waveforms show normal direction of venous flow, normal respiratory plasticity and response to augmentation. Limited views of the contralateral common femoral vein are unremarkable. OTHER None. Limitations: none IMPRESSION: Negative. Electronically Signed   By: Malachy Moan M.D.   On: 01/12/2020 16:18    Procedures Procedures (including critical care time)  Medications Ordered in ED Medications - No data to display  ED Course  I have reviewed the triage vital signs and the nursing notes.  Pertinent labs & imaging results that were available during my care of the patient were reviewed by me and considered in my medical decision making (see chart for details).    MDM Rules/Calculators/A&P                          49 year old female who presents for evaluation of left calf pain that began yesterday.  Sent by urgent care for evaluation of DVT.  No redness, warmth, swelling.  No PE or DVT risk factors.  Initially arrival, she is afebrile, nontoxic-appearing.  Vital signs are stable.  She is  neurovascularly intact.  No tenderness palpation of the left calf on my exam.  Patient states she does not have pain right now.  Doubt DVT given lack of risk factors, physical exam.  Question of this is a muscle strain.  History/physical exam not concerning for ischemic limb, septic arthritis.  Do not suspect cellulitis given history/physical exam.  Will obtain ultrasound. Patient with no bony tenderness noted on exam. No history of trauma/injury or fall. No indication for XR imaging.   [place] reviewed. Negative for acute DVT.  Discussed results with patient. Discussed with that this could still be a MSK injury. Encouraged at home supportive care measures. At this time, patient exhibits no emergent life-threatening condition that require further evaluation in ED. Patient had ample opportunity for questions and discussion. All patient's questions were answered with full understanding. Strict return precautions discussed. Patient expresses understanding and agreement to plan.   Portions of this note were generated with Scientist, clinical (histocompatibility and immunogenetics). Dictation errors may occur despite best attempts at proofreading.  Final Clinical Impression(s) / ED Diagnoses Final diagnoses:  Pain of left calf    Rx / DC Orders ED Discharge Orders    None       Rosana Hoes 01/12/20 1637    Bethann Berkshire, MD 01/13/20 2236

## 2020-01-12 NOTE — ED Triage Notes (Signed)
Pt reports left leg/calf pain that started last night while at work. Pt was sent from [facility] for [place].

## 2020-01-12 NOTE — Discharge Instructions (Signed)
Your Ultrasound today was reassuring and did not show any evidence of a blood clot in your leg.   As we discussed this could be musculoskeletal.   You can take Tylenol or Ibuprofen as directed for pain. You can alternate Tylenol and Ibuprofen every 4 hours. If you take Tylenol at 1pm, then you can take Ibuprofen at 5pm. Then you can take Tylenol again at 9pm.   Follow up with your primary care doctor.   Return to the Emergency Dept for any worsening pain, redness or swelling, or any other worsening or concerning symptoms.

## 2020-03-28 ENCOUNTER — Other Ambulatory Visit: Payer: Self-pay | Admitting: Family Medicine

## 2020-03-28 DIAGNOSIS — E7800 Pure hypercholesterolemia, unspecified: Secondary | ICD-10-CM | POA: Insufficient documentation

## 2020-03-28 LAB — LIPID PANEL
Cholesterol: 255 — AB (ref 0–200)
HDL: 40 (ref 35–70)
LDL Cholesterol: 178
Triglycerides: 186 — AB (ref 40–160)

## 2020-03-28 LAB — COMPREHENSIVE METABOLIC PANEL
Albumin: 4.1 (ref 3.5–5.0)
Calcium: 10 (ref 8.7–10.7)
eGFR: 102

## 2020-03-28 LAB — BASIC METABOLIC PANEL
BUN: 10 (ref 4–21)
CO2: 24 — AB (ref 13–22)
Chloride: 103 (ref 99–108)
Creatinine: 0.7 (ref 0.5–1.1)
Glucose: 63
Potassium: 4.1 mEq/L (ref 3.5–5.1)
Sodium: 138 (ref 137–147)

## 2020-03-28 LAB — HEPATIC FUNCTION PANEL
ALT: 23 U/L (ref 7–35)
AST: 21 (ref 13–35)
Alkaline Phosphatase: 63 (ref 25–125)
Bilirubin, Total: 0.7

## 2020-03-29 ENCOUNTER — Other Ambulatory Visit: Payer: Self-pay | Admitting: Family Medicine

## 2020-03-29 DIAGNOSIS — N644 Mastodynia: Secondary | ICD-10-CM

## 2020-04-06 ENCOUNTER — Ambulatory Visit
Admission: RE | Admit: 2020-04-06 | Discharge: 2020-04-06 | Disposition: A | Discharge status: Home / Self Care | Payer: No Typology Code available for payment source | Source: Ambulatory Visit | Attending: Family Medicine | Admitting: Family Medicine

## 2020-04-06 ENCOUNTER — Other Ambulatory Visit: Payer: Self-pay

## 2020-04-06 DIAGNOSIS — N644 Mastodynia: Secondary | ICD-10-CM | POA: Diagnosis not present

## 2021-04-17 DIAGNOSIS — R002 Palpitations: Secondary | ICD-10-CM | POA: Insufficient documentation

## 2021-05-22 ENCOUNTER — Other Ambulatory Visit: Payer: Self-pay | Admitting: Family Medicine

## 2021-05-22 DIAGNOSIS — Z1231 Encounter for screening mammogram for malignant neoplasm of breast: Secondary | ICD-10-CM

## 2021-07-01 IMAGING — US US EXTREM LOW VENOUS*L*
1 series · 14 of 24 positions shown · non-contrast
Comparison: None.

CLINICAL DATA: Left lower extremity edema

EXAM:
LEFT LOWER EXTREMITY VENOUS DOPPLER ULTRASOUND
TECHNIQUE: Gray-scale sonography with compression, as well as color and duplex
ultrasound, were performed to evaluate the deep venous system(s)
from the level of the common femoral vein through the popliteal and
proximal calf veins.

[Series 1: us extrem low venous*left* · 0.08mm/px · 14 of 34 slices shown]
[im 1/34]
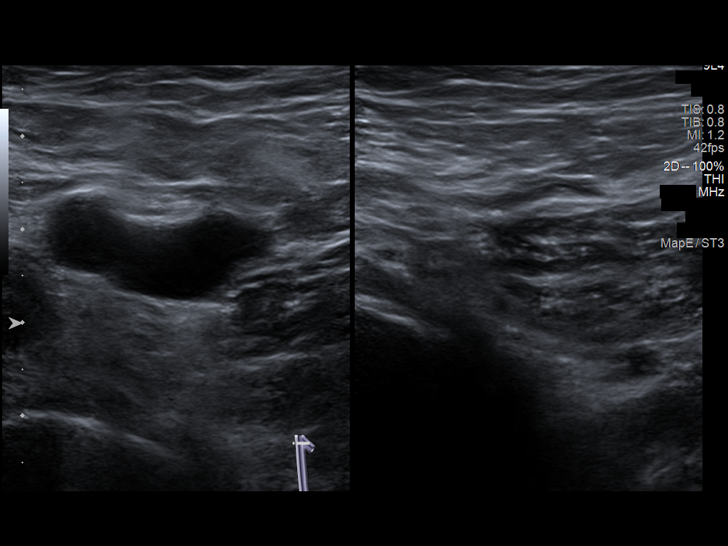
[im 3/34]
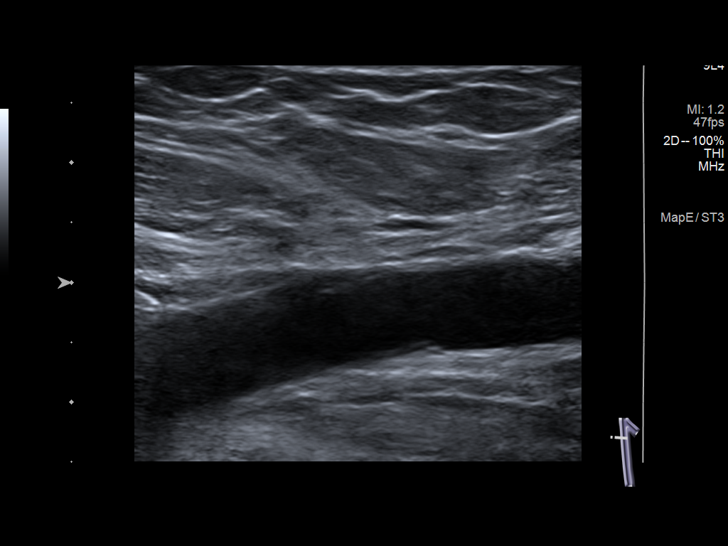
[im 6/34]
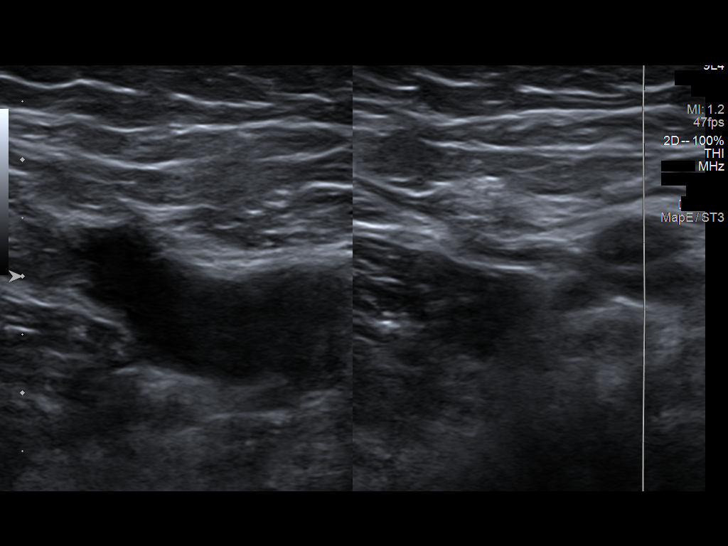
[im 9/34]
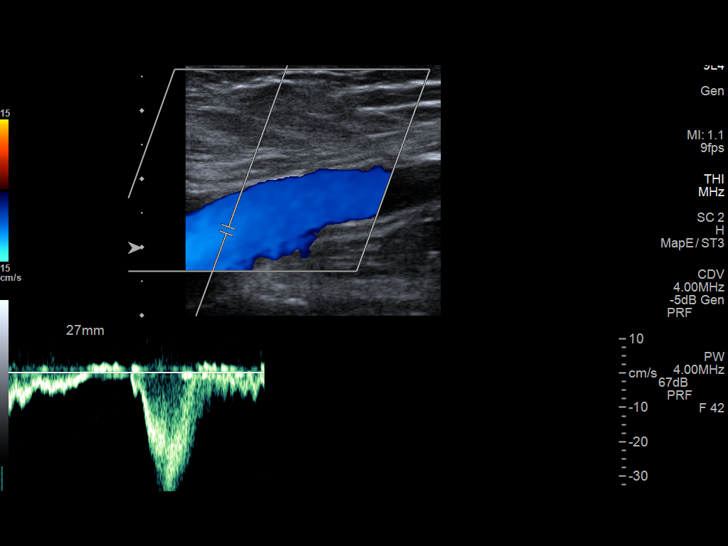
[im 11/34]
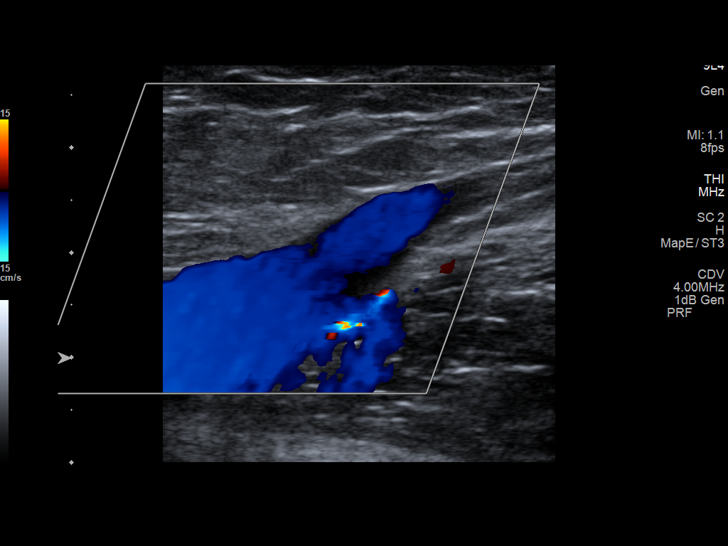
[im 13/34]
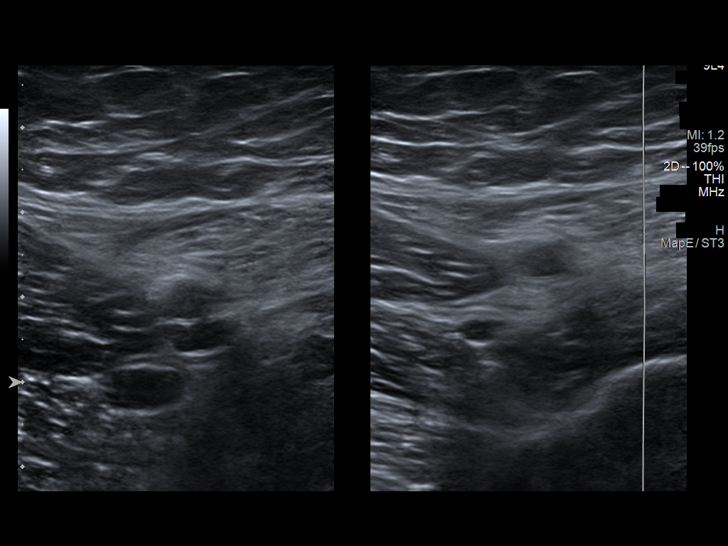
[im 16/34]
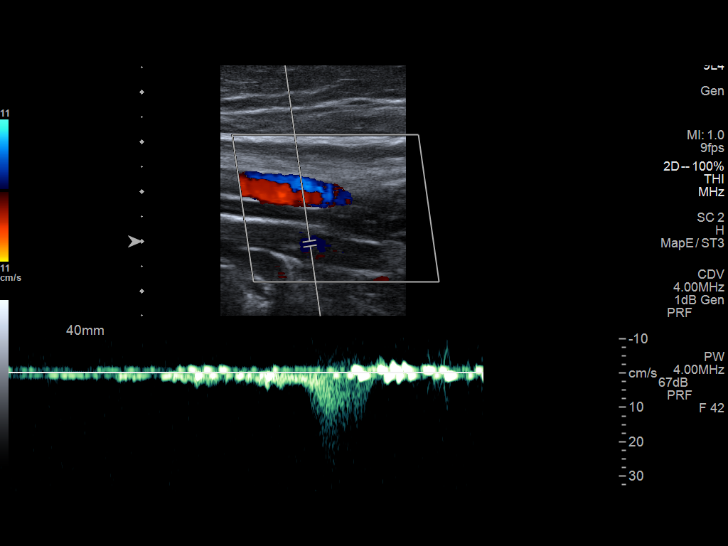
[im 18/34]
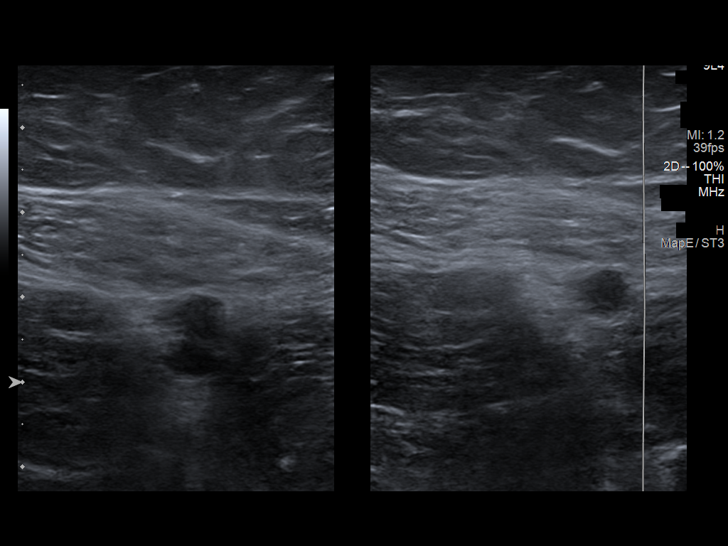
[im 21/34]
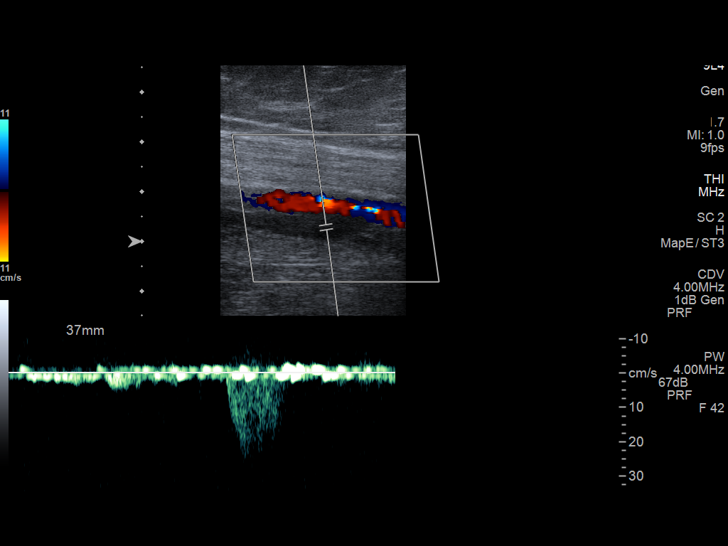
[im 23/34]
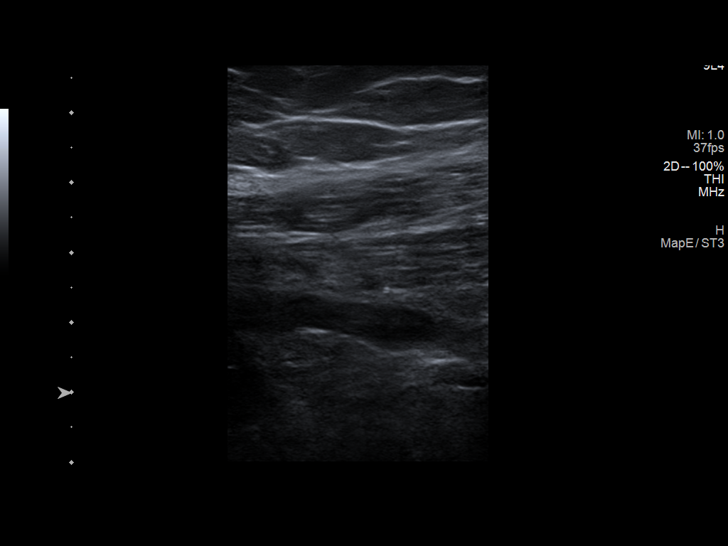
[im 26/34]
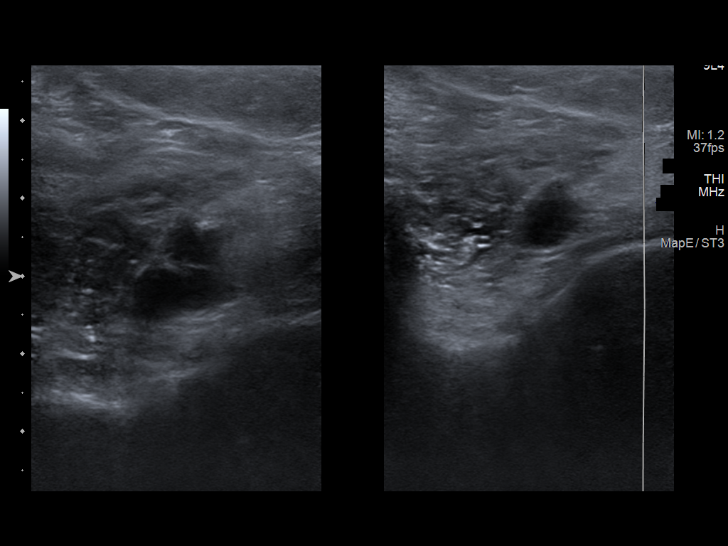
[im 28/34]
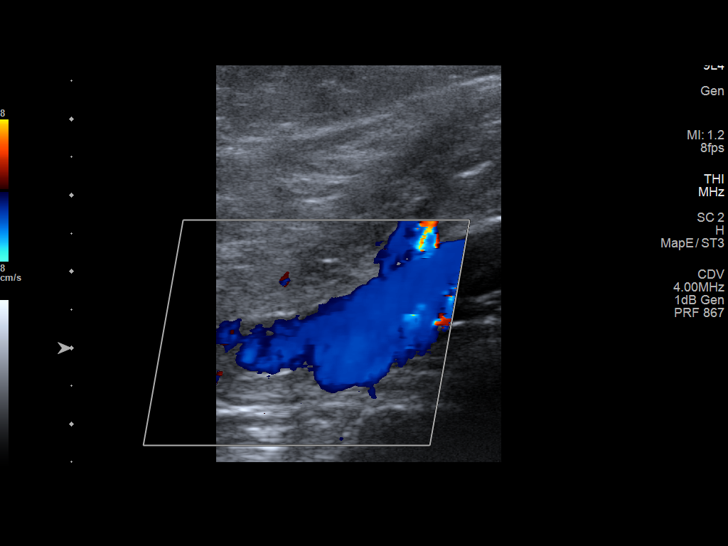
[im 31/34]
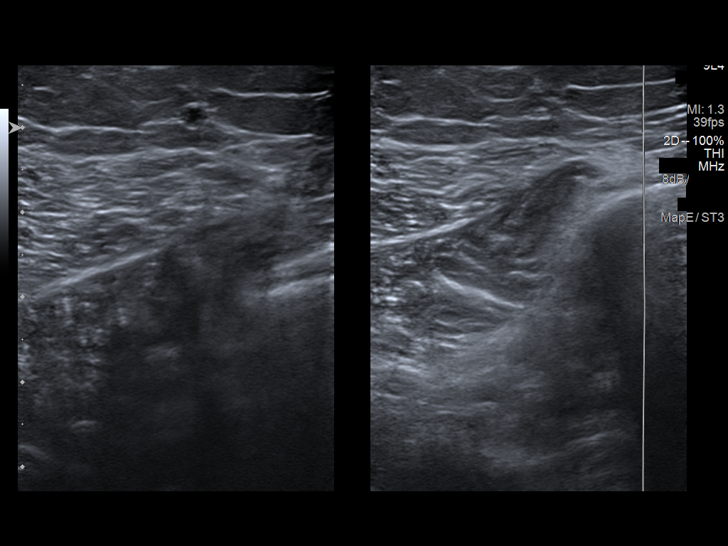
[im 34/34]
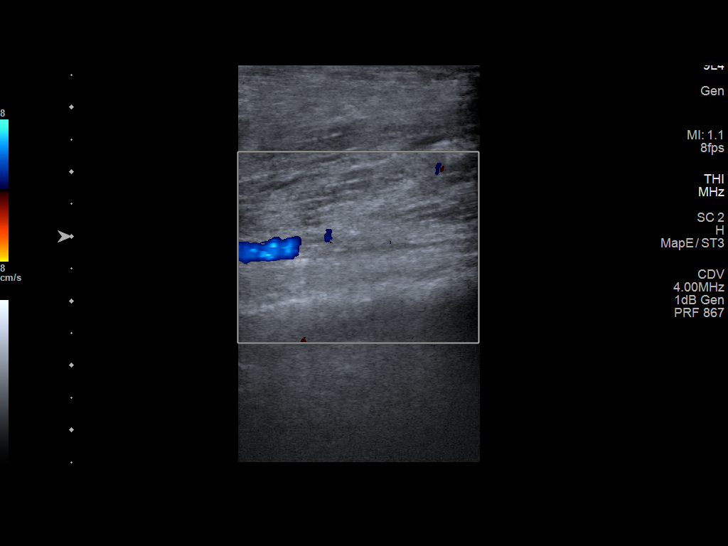

[14 of 24 positions shown; findings below may reference images not displayed]

FINDINGS: VENOUS

Normal compressibility of the common femoral, superficial femoral,
and popliteal veins, as well as the visualized calf veins.
Visualized portions of profunda femoral vein and great saphenous
vein unremarkable. No filling defects to suggest DVT on grayscale or
color Doppler imaging. Doppler waveforms show normal direction of
venous flow, normal respiratory plasticity and response to
augmentation.

Limited views of the contralateral common femoral vein are
unremarkable.

OTHER

None.

Limitations: none
IMPRESSION: Negative.

## 2021-08-01 ENCOUNTER — Emergency Department (HOSPITAL_COMMUNITY)
Admission: EM | Admit: 2021-08-01 | Discharge: 2021-08-02 | Disposition: A | Discharge status: Home / Self Care | Payer: No Typology Code available for payment source | Attending: Emergency Medicine | Admitting: Emergency Medicine

## 2021-08-01 ENCOUNTER — Emergency Department (HOSPITAL_COMMUNITY): Payer: No Typology Code available for payment source

## 2021-08-01 ENCOUNTER — Other Ambulatory Visit: Payer: Self-pay

## 2021-08-01 ENCOUNTER — Encounter (HOSPITAL_COMMUNITY): Payer: Self-pay | Admitting: Emergency Medicine

## 2021-08-01 DIAGNOSIS — R079 Chest pain, unspecified: Secondary | ICD-10-CM | POA: Diagnosis present

## 2021-08-01 DIAGNOSIS — N9489 Other specified conditions associated with female genital organs and menstrual cycle: Secondary | ICD-10-CM | POA: Insufficient documentation

## 2021-08-01 DIAGNOSIS — I10 Essential (primary) hypertension: Secondary | ICD-10-CM | POA: Diagnosis not present

## 2021-08-01 DIAGNOSIS — R002 Palpitations: Secondary | ICD-10-CM

## 2021-08-01 HISTORY — DX: Essential (primary) hypertension: I10

## 2021-08-01 LAB — BASIC METABOLIC PANEL
Anion gap: 8 (ref 5–15)
BUN: 13 mg/dL (ref 6–20)
CO2: 22 mmol/L (ref 22–32)
Calcium: 9.3 mg/dL (ref 8.9–10.3)
Chloride: 108 mmol/L (ref 98–111)
Creatinine, Ser: 0.83 mg/dL (ref 0.44–1.00)
GFR, Estimated: >60 mL/min (ref >60)
Glucose, Bld: 105 mg/dL — ABNORMAL HIGH (ref 70–99)
Potassium: 3.8 mmol/L (ref 3.5–5.1)
Sodium: 138 mmol/L (ref 135–145)

## 2021-08-01 LAB — CBC
HCT: 40.1 % (ref 36.0–46.0)
Hemoglobin: 13.1 g/dL (ref 12.0–15.0)
MCH: 27.9 pg (ref 26.0–34.0)
MCHC: 32.7 g/dL (ref 30.0–36.0)
MCV: 85.5 fL (ref 80.0–100.0)
Platelets: 292 10*3/uL (ref 150–400)
RBC: 4.69 MIL/uL (ref 3.87–5.11)
RDW: 13.9 % (ref 11.5–15.5)
WBC: 8.2 10*3/uL (ref 4.0–10.5)
nRBC: 0 % (ref 0.0–0.2)

## 2021-08-01 LAB — I-STAT BETA HCG BLOOD, ED (MC, WL, AP ONLY): I-stat hCG, quantitative: <5 m[IU]/mL (ref <5)

## 2021-08-01 LAB — TROPONIN I (HIGH SENSITIVITY): Troponin I (High Sensitivity): 3 ng/L (ref <18)

## 2021-08-01 NOTE — ED Provider Triage Note (Signed)
Emergency Medicine Provider Triage Evaluation Note  EVELYNE PICKER , a 51 y.o. female  was evaluated in triage.  Pt complains of substernal chest pressure that started when she walked into work.  Did have some shortness of breath.  Patient does have a history of hyperlipidemia.  No nausea, vomiting, diarrhea.  Pain has been constant since onset.  Review of Systems  Positive:  Negative: See above  Physical Exam  BP (!) 154/83   Pulse 67   Temp 98.2 F (36.8 C)   Resp 16   Wt 68 kg   LMP 07/17/2021 (Approximate)   SpO2 98%   BMI 28.34 kg/m  Gen:   Awake, no distress   Resp:  Normal effort  MSK:   Moves extremities without difficulty  Other:    Medical Decision Making  Medically screening exam initiated at 9:49 PM.  Appropriate orders placed.  KAYLAND YEUNG was informed that the remainder of the evaluation will be completed by another provider, this initial triage assessment does not replace that evaluation, and the importance of remaining in the ED until their evaluation is complete.     Myna Bright [place], [place] 08/01/21 2150

## 2021-08-01 NOTE — ED Triage Notes (Addendum)
Pt presents for chest pain that feels like squeezing pressure this afternoon while walking to her office at work, became diaphoretic during that episode. Was recommended EMS transport by medic, but declined as she preferred to come to this location.  Started a new medication today : crestor. Endorses SOB with exertion. Denies N/V/D  H/o htn  Has not received ASA or nitro with EMS

## 2021-08-02 LAB — TROPONIN I (HIGH SENSITIVITY): Troponin I (High Sensitivity): 3 ng/L (ref <18)

## 2021-08-02 NOTE — ED Provider Notes (Signed)
[facility] EMERGENCY DEPARTMENT Provider Note   CSN: [id number] Arrival date & time: 08/01/21  2115     History  Chief Complaint  Patient presents with   Chest Pain    Cristina Aguilar is a 51 y.o. female.  The history is provided by the patient.  Chest Pain She has history of hypertension, hyperlipidemia hyperlipidemia and comes in because of an episode of chest pain.  She was walking into work when she felt her heart racing and then noted heavy feeling in her chest with associated clamminess.  She denies dyspnea or nausea.  Episode lasted an estimated 5-10 minutes before resolving.  She feels like she is back to her baseline.  She has never had any episodes like this before.  She is a non-smoker and denies history of diabetes and denies family history of premature coronary atherosclerosis.   Home Medications Prior to Admission medications   Medication Sig Start Date End Date Taking? Authorizing Provider  benzonatate (TESSALON) 200 MG capsule Take 1 capsule (200 mg total) by mouth 3 (three) times daily as needed for cough. 08/22/16   Domenick Gong, MD  LevoFLOXacin (LEVAQUIN PO) Take by mouth.    [provider]  naproxen (NAPROSYN) 375 MG tablet Take 1 tablet (375 mg total) by mouth 2 (two) times daily. 02/06/17   Burgess Amor, PA-C  pantoprazole (PROTONIX) 20 MG tablet Take 1 tablet (20 mg total) by mouth 2 (two) times daily. 02/06/17   Burgess Amor, PA-C  predniSONE (STERAPRED UNI-PAK 21 TAB) 10 MG (21) TBPK tablet Take 10 mg by mouth daily.    [provider]      Allergies    Codeine, Morphine and related, and Sulfa antibiotics    Review of Systems   Review of Systems  Cardiovascular:  Positive for chest pain.  All other systems reviewed and are negative.  Physical Exam Updated Vital Signs BP (!) 150/89   Pulse 61   Temp 98.2 F (36.8 C)   Resp 20   Wt 68 kg   LMP 07/17/2021 (Approximate)   SpO2 100%   BMI 28.34 kg/m   Physical Exam Vitals and nursing note reviewed.  51 year old female, resting comfortably and in no acute distress. Vital signs are significant for mildly elevated blood pressure. Oxygen saturation is 100%, which is normal. Head is normocephalic and atraumatic. PERRLA, EOMI. Oropharynx is clear. Neck is nontender and supple without adenopathy or JVD. Back is nontender and there is no CVA tenderness. Lungs are clear without rales, wheezes, or rhonchi. Chest is nontender. Heart has regular rate and rhythm without murmur. Abdomen is soft, flat, nontender. Extremities have no cyanosis or edema, full range of motion is present. Skin is warm and dry without rash. Neurologic: Mental status is normal, cranial nerves are intact, moves all extremities equally.  ED Results / Procedures / Treatments   Labs (all labs ordered are listed, but only abnormal results are displayed) Labs Reviewed  BASIC METABOLIC PANEL - Abnormal; Notable for the following components:      Result Value   Glucose, Bld 105 (*)    All other components within normal limits  CBC  I-STAT BETA HCG BLOOD, ED (MC, [facility], AP ONLY)  TROPONIN I (HIGH SENSITIVITY)  TROPONIN I (HIGH SENSITIVITY)    EKG EKG Interpretation  Date/Time:  Thursday Aug 01 2021 21:21:03 EDT Ventricular Rate:  81 PR Interval:  128 QRS Duration: 78 QT Interval:  356 QTC Calculation: 413 R Axis:  75 Text Interpretation: Normal sinus rhythm Nonspecific ST abnormality Abnormal ECG When compared with ECG of 05-Feb-2017 22:35, No significant change was found Confirmed by Dione Booze ([id number]) on 08/02/2021 12:11:30 AM  Radiology DG Chest 2 View  Result Date: 08/01/2021 CLINICAL DATA:  Chest pain. EXAM: CHEST - 2 VIEW COMPARISON:  02/05/2017. FINDINGS: The heart size and mediastinal contours are within normal limits. Minimal subsegmental atelectasis is noted at the left lung base. No consolidation, effusion, or pneumothorax. No acute osseous abnormality.  IMPRESSION: Mild subsegmental atelectasis at the left lung base. Electronically Signed   By: Thornell Sartorius M.D.   On: 08/01/2021 21:46    Procedures Procedures  Cardiac monitor shows normal sinus rhythm, per my interpretation.  Medications Ordered in ED Medications - No data to display  ED Course/ Medical Decision Making/ A&P                           Medical Decision Making  Episode of palpitations and chest pain.  Pain certainly sounds anginal, but I suspect that it was secondary to her tachycardia.  At this point, unable to determine if tachycardia was PSVT or atrial fibrillation or atrial flutter.  I have reviewed and interpreted the ECG on arrival here, and it shows nonspecific ST changes which are unchanged from prior ECG from 2018.  Chest x-ray shows no acute process.  I have independently viewed the images, and agree with radiologist interpretation.  Have reviewed and interpreted the laboratory tests and they are normal including troponin x2.  Heart score is 3, which puts her at low risk for major adverse cardiac events in the next 6 weeks.  Old records are reviewed, she has no prior cardiac evaluation.  I do feel that she will need some kind of outpatient cardiac monitoring to make sure she is not having paroxysmal atrial fibrillation.  She is given an ambulatory referral to cardiology to pursue that, told to return immediately if she has recurrent symptoms.  Final Clinical Impression(s) / ED Diagnoses Final diagnoses:  Nonspecific chest pain  Palpitations  Elevated blood pressure reading with diagnosis of hypertension    Rx / DC Orders ED Discharge Orders     None         Dione Booze, MD 08/02/21 [phone]

## 2021-08-15 NOTE — Progress Notes (Unsigned)
CARDIOLOGY CONSULT NOTE       Patient ID: Cristina Aguilar MRN: [id number] DOB/AGE: [birth year] 50 y.o.  Admit date: (Not on file) Referring Physician: Clint Guy Primary Physician: Armando Gang, FNP Primary Cardiologist: New Reason for Consultation: Palpitations  Active Problems:   * No active hospital problems. *   HPI:  51 y.o. 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 There were horizontal ST depression during exercise. This treadmill test was equivocal. 2D echocardiogram on 06/28/2020 revealed normal left ventricular function with LVEF 50 to 55% with mild mitral and tricuspid regurgitation.   Some stress with sister passing 02/2021 Palpitations for year s She was referred to primary care with no further testing   She works for [employer] in [place] but lives in [place] She was confused about lack clarity with her prior ETT results    ROS All other systems reviewed and negative except as noted above  Past Medical History:  Diagnosis Date   Hypertension     Family History  Problem Relation Age of Onset   Breast cancer Maternal Grandmother        mat great gm    Social History   Socioeconomic History   Marital status:  Married    Spouse name: Not on file   Number of children: Not on file   Years of education: Not on file   Highest education level: Not on file  Occupational History   Not on file  Tobacco Use   Smoking status: Former    Types: Cigarettes    Quit date: 08/23/2006    Years since quitting: 15.0   Smokeless tobacco: Never  Substance and Sexual Activity   Alcohol use: No   Drug use: Not on file   Sexual activity: Not on file  Other Topics Concern   Not on file  Social History Narrative   Not on file   Social Determinants of Health   Financial Resource Strain: Not on file  Food Insecurity: Not on file  Transportation Needs: Not on file  Physical Activity: Not on file  Stress: Not on file  Social Connections: Not on file  Intimate Partner Violence: Not on file    Past Surgical History:  Procedure Laterality Date   TONSILLECTOMY     TUBAL LIGATION        Current Outpatient Medications:    hydrOXYzine (VISTARIL) 25 MG capsule, Take 25 mg by mouth 3 (three) times daily as needed. (Patient not taking: Reported on 08/20/2021), Disp: , Rfl:    naproxen (NAPROSYN) 375 MG tablet, Take 1 tablet (375 mg total) by mouth 2 (two) times daily. (Patient not taking: Reported on 08/20/2021), Disp: 20 tablet, Rfl: 0   pantoprazole (  PROTONIX) 20 MG tablet, Take 1 tablet (20 mg total) by mouth 2 (two) times daily. (Patient not taking: Reported on 08/20/2021), Disp: 30 tablet, Rfl: 0   rosuvastatin (CRESTOR) 20 MG tablet, Take 20 mg by mouth at bedtime. (Patient not taking: Reported on 08/20/2021), Disp: , Rfl:     Physical Exam: Blood pressure 110/64, pulse 77, height 5\' 1"  (1.549 m), weight 146 lb (66.2 kg), last menstrual period 07/17/2021, SpO2 97 %.    Affect appropriate Healthy:  appears stated age HEENT: normal Neck supple with no adenopathy JVP normal no bruits no thyromegaly Lungs clear with no wheezing and good diaphragmatic motion Heart:  S1/S2 no murmur, no rub, gallop or  click PMI normal Abdomen: benighn, BS positve, no tenderness, no AAA no bruit.  No HSM or HJR Distal pulses intact with no bruits No edema Neuro non-focal Skin warm and dry No muscular weakness   Labs:   Lab Results  Component Value Date   WBC 8.2 08/01/2021   HGB 13.1 08/01/2021   HCT 40.1 08/01/2021   MCV 85.5 08/01/2021   PLT 292 08/01/2021   No results for input(s): "NA", "K", "CL", "CO2", "BUN", "CREATININE", "CALCIUM", "PROT", "BILITOT", "ALKPHOS", "ALT", "AST", "GLUCOSE" in the last 168 hours.  Invalid input(s): "LABALBU" No results found for: "CKTOTAL", "CKMB", "CKMBINDEX", "TROPONINI" No results found for: "CHOL" No results found for: "HDL" No results found for: "LDLCALC" No results found for: "TRIG" No results found for: "CHOLHDL" No results found for: "LDLDIRECT"    Radiology: DG Chest 2 View  Result Date: 08/01/2021 CLINICAL DATA:  Chest pain. EXAM: CHEST - 2 VIEW COMPARISON:  02/05/2017. FINDINGS: The heart size and mediastinal contours are within normal limits. Minimal subsegmental atelectasis is noted at the left lung base. No consolidation, effusion, or pneumothorax. No acute osseous abnormality. IMPRESSION: Mild subsegmental atelectasis at the left lung base. Electronically Signed   By: 02/07/2017 M.D.   On: 08/01/2021 21:46    EKG: See HPI   ASSESSMENT AND PLAN:   Palpitations:  chronic worse with stress recent episode in ER with nothing on telemetry or ECG May have occult PSVT  30 day monitor and PRN inderal Recent echo at [facility] benign  Check TSH/T4 Chest Pain:  with r/o in ER and nonspecific ST changes on ECG equivocal ETT at [facility] 06/28/20 Shared decision making favor gated cardiac CTA   TSH/T4 30 day monitor PRN Inderal 10 mg Cardiac CTA 100 mg Lopressor 2 hours before scan  F/U PRN pending studies   Signed: 06/30/20 08/20/2021, 4:03 PM

## 2021-08-20 ENCOUNTER — Ambulatory Visit: Payer: No Typology Code available for payment source | Admitting: Cardiovascular Disease

## 2021-08-20 ENCOUNTER — Encounter: Payer: Self-pay | Admitting: Radiology

## 2021-08-20 ENCOUNTER — Encounter: Payer: Self-pay | Admitting: Cardiovascular Disease

## 2021-08-20 VITALS — BP 110/64 | HR 77 | Ht 61.0 in | Wt 146.0 lb

## 2021-08-20 DIAGNOSIS — R002 Palpitations: Secondary | ICD-10-CM | POA: Diagnosis not present

## 2021-08-20 DIAGNOSIS — R079 Chest pain, unspecified: Secondary | ICD-10-CM | POA: Diagnosis not present

## 2021-08-20 MED ORDER — METOPROLOL TARTRATE 100 MG PO TABS: ORAL_TABLET | ORAL | 0 refills | Status: DC

## 2021-08-20 MED ORDER — PROPRANOLOL HCL 10 MG PO TABS: 10.0000 mg | ORAL_TABLET | ORAL | 3 refills | Status: DC | PRN

## 2021-08-20 NOTE — Patient Instructions (Addendum)
Medication Instructions:  Your physician has recommended you make the following change in your medication: 1-TAKE Inderal 10 mg by mouth as needed for palpitations.  *If you need a refill on your cardiac medications before your next appointment, please call your pharmacy*  Lab Work: Your physician recommends that you have lab work today- BMET, TSH and T4  If you have labs (blood work) drawn today and your tests are completely normal, you will receive your results only by: [place] (if you have [place]) OR A paper copy in the mail If you have any lab test that is abnormal or we need to change your treatment, we will call you to review the results.  Testing/Procedures: Your physician has requested that you have cardiac CT. Cardiac computed tomography (CT) is a painless test that uses an x-ray machine to take clear, detailed pictures of your heart. For further information please visit [email or web address]. Please follow instruction sheet as given.  Your physician has recommended that you wear an event monitor. Event monitors are medical devices that record the heart's electrical activity. Doctors most often [place] these monitors to diagnose arrhythmias. Arrhythmias are problems with the speed or rhythm of the heartbeat. The monitor is a small, portable device. You can wear one while you do your normal daily activities. This is usually used to diagnose what is causing palpitations/syncope (passing out).  Follow-Up: At [facility], you and your health needs are our priority.  As part of our continuing mission to provide you with exceptional heart care, we have created designated Provider Care Teams.  These Care Teams include your primary Cardiologist (physician) and Advanced Practice Providers (APPs -  Physician Assistants and Nurse Practitioners) who all work together to provide you with the care you need, when you need it.  We recommend signing up for the patient portal called "MyChart".  Sign  up information is provided on this After Visit Summary.  MyChart is used to connect with patients for Virtual Visits (Telemedicine).  Patients are able to view lab/test results, encounter notes, upcoming appointments, etc.  Non-urgent messages can be sent to your provider as well.   To learn more about what you can do with MyChart, go to [email or web address].    Your next appointment:   As needed  The format for your next appointment:   In Person  Provider:   Jenkins Rouge, MD {   Other Instructions   Your cardiac CT will be scheduled at one of the below locations:   [facility] [address] [place], [place] [address] [phone]  If scheduled at [facility], please arrive at the [facility] and Children's Entrance (Entrance C2) of [facility] 30 minutes prior to test start time. You can use the FREE valet parking offered at entrance C (encouraged to control the heart rate for the test)  Proceed to the [facility] Radiology Department (first floor) to check-in and test prep.  All radiology patients and guests should use entrance C2 at [facility], accessed from [facility], even though the hospital's physical address listed is [address].     Please follow these instructions carefully (unless otherwise directed):  On the Night Before the Test: Be sure to Drink plenty of water. Do not consume any caffeinated/decaffeinated beverages or chocolate 12 hours prior to your test. Do not take any antihistamines 12 hours prior to your test.  On the Day of the Test: Drink plenty of water until 1 hour prior  to the test. Do not eat any food 4 hours prior to the test. You may take your regular medications prior to the test.  Take metoprolol (Lopressor) 100 mg  two hours prior to test. FEMALES- please wear underwire-free bra if available, avoid dresses & tight clothing      After the Test: Drink plenty of water. After  receiving IV contrast, you may experience a mild flushed feeling. This is normal. On occasion, you may experience a mild rash up to 24 hours after the test. This is not dangerous. If this occurs, you can take Benadryl 25 mg and increase your fluid intake. If you experience trouble breathing, this can be serious. If it is severe call 911 IMMEDIATELY. If it is mild, please call our office.  We will call to schedule your test 2-4 weeks out understanding that some insurance companies will need an authorization prior to the service being performed.   For non-scheduling related questions, please contact the cardiac imaging nurse navigator should you have any questions/concerns: Marchia Bond, Cardiac Imaging Nurse Navigator Gordy Clement, Cardiac Imaging Nurse Navigator  Heart and Vascular Services Direct Office Dial: [phone]   For scheduling needs, including cancellations and rescheduling, please call [place], [phone].   Important Information About Sugar

## 2021-08-20 NOTE — Progress Notes (Signed)
Enrolled patient for a 30 day [employer] Event monitor to be mailed to patients home.  

## 2021-08-20 NOTE — Addendum Note (Signed)
Addended by: Virl Axe, Deontez Klinke L on: 08/20/2021 04:34 PM   Modules accepted: Orders

## 2021-08-21 LAB — BASIC METABOLIC PANEL
BUN/Creatinine Ratio: 21 (ref 9–23)
BUN: 16 mg/dL (ref 6–24)
CO2: 19 mmol/L — ABNORMAL LOW (ref 20–29)
Calcium: 9.2 mg/dL (ref 8.7–10.2)
Chloride: 102 mmol/L (ref 96–106)
Creatinine, Ser: 0.75 mg/dL (ref 0.57–1.00)
Glucose: 95 mg/dL (ref 70–99)
Potassium: 4 mmol/L (ref 3.5–5.2)
Sodium: 139 mmol/L (ref 134–144)
eGFR: 97 mL/min/{1.73_m2} (ref >59)

## 2021-08-21 LAB — TSH: TSH: 0.583 u[IU]/mL (ref 0.450–4.500)

## 2021-08-21 LAB — T4, FREE: Free T4: 0.89 ng/dL (ref 0.82–1.77)

## 2021-08-27 ENCOUNTER — Ambulatory Visit (INDEPENDENT_AMBULATORY_CARE_PROVIDER_SITE_OTHER): Payer: No Typology Code available for payment source

## 2021-08-27 DIAGNOSIS — R002 Palpitations: Secondary | ICD-10-CM

## 2021-08-27 DIAGNOSIS — R079 Chest pain, unspecified: Secondary | ICD-10-CM

## 2021-08-29 ENCOUNTER — Ambulatory Visit
Admission: RE | Admit: 2021-08-29 | Discharge: 2021-08-29 | Disposition: A | Discharge status: Home / Self Care | Payer: No Typology Code available for payment source | Source: Ambulatory Visit | Attending: Family Medicine | Admitting: Family Medicine

## 2021-08-29 DIAGNOSIS — Z1231 Encounter for screening mammogram for malignant neoplasm of breast: Secondary | ICD-10-CM

## 2021-09-05 ENCOUNTER — Encounter: Payer: Self-pay | Admitting: Cardiovascular Disease

## 2021-09-09 ENCOUNTER — Telehealth (HOSPITAL_COMMUNITY): Payer: Self-pay | Admitting: *Deleted

## 2021-09-09 NOTE — Telephone Encounter (Signed)
Reaching out to patient to offer assistance regarding upcoming cardiac imaging study; pt verbalizes understanding of appt date/time, parking situation and where to check in, pre-test NPO status and medications ordered, and verified current allergies; name and call back number provided for further questions should they arise  Larey Brick RN Navigator Cardiac Imaging [facility] [phone] office [phone] cell  Patient to take 100mg  metoprolol tartrate TWO hours prior to her cardiac CT scan.  She is aware to arrive at 3pm.

## 2021-09-11 ENCOUNTER — Ambulatory Visit (HOSPITAL_COMMUNITY)
Admission: RE | Admit: 2021-09-11 | Discharge: 2021-09-11 | Disposition: A | Discharge status: Home / Self Care | Payer: No Typology Code available for payment source | Source: Ambulatory Visit | Attending: Cardiovascular Disease | Admitting: Cardiovascular Disease

## 2021-09-11 DIAGNOSIS — R079 Chest pain, unspecified: Secondary | ICD-10-CM | POA: Insufficient documentation

## 2021-09-11 MED ORDER — IOHEXOL 350 MG/ML SOLN
100.0000 mL | Freq: Once | INTRAVENOUS | Status: AC | PRN
  Administered 2021-09-11: 100 mL via INTRAVENOUS

## 2021-09-11 MED ORDER — NITROGLYCERIN 0.4 MG SL SUBL
SUBLINGUAL_TABLET | SUBLINGUAL | Status: AC
  Filled 2021-09-11: qty 2

## 2021-09-11 MED ORDER — NITROGLYCERIN 0.4 MG SL SUBL
0.8000 mg | SUBLINGUAL_TABLET | Freq: Once | SUBLINGUAL | Status: AC
  Administered 2021-09-11: 0.8 mg via SUBLINGUAL

## 2022-04-11 ENCOUNTER — Ambulatory Visit: Payer: Self-pay | Admitting: Family Medicine

## 2022-08-21 ENCOUNTER — Ambulatory Visit: Payer: Self-pay | Admitting: Family Medicine

## 2022-09-24 ENCOUNTER — Ambulatory Visit (INDEPENDENT_AMBULATORY_CARE_PROVIDER_SITE_OTHER): Payer: No Typology Code available for payment source | Admitting: Physician Assistant

## 2022-09-24 ENCOUNTER — Encounter: Payer: Self-pay | Admitting: Physician Assistant

## 2022-09-24 ENCOUNTER — Other Ambulatory Visit: Payer: Self-pay | Admitting: Physician Assistant

## 2022-09-24 VITALS — BP 110/78 | HR 90 | Ht 61.0 in | Wt 147.0 lb

## 2022-09-24 DIAGNOSIS — Z87891 Personal history of nicotine dependence: Secondary | ICD-10-CM | POA: Insufficient documentation

## 2022-09-24 DIAGNOSIS — Z114 Encounter for screening for human immunodeficiency virus [HIV]: Secondary | ICD-10-CM

## 2022-09-24 DIAGNOSIS — Z1211 Encounter for screening for malignant neoplasm of colon: Secondary | ICD-10-CM | POA: Diagnosis not present

## 2022-09-24 DIAGNOSIS — E782 Mixed hyperlipidemia: Secondary | ICD-10-CM | POA: Diagnosis not present

## 2022-09-24 DIAGNOSIS — G2581 Restless legs syndrome: Secondary | ICD-10-CM | POA: Insufficient documentation

## 2022-09-24 DIAGNOSIS — Z1231 Encounter for screening mammogram for malignant neoplasm of breast: Secondary | ICD-10-CM

## 2022-09-24 DIAGNOSIS — Z0000 Encounter for general adult medical examination without abnormal findings: Secondary | ICD-10-CM | POA: Diagnosis not present

## 2022-09-24 NOTE — Progress Notes (Signed)
Date:  09/24/2022   Name:  Cristina Aguilar   DOB:  1970/12/20   MRN:  [id number]   Chief Complaint: Establish Care  HPI  Cristina Aguilar is a 52 y.o. female who presents today for her Complete Annual Exam. She feels well. She reports exercising personal trainer 2 days a week 30 mins-1 hour and swimming. She reports she is sleeping well. Breast complaints none.  Last Physical: 2022 Last Dental Exam: 1 mo ago Last Eye Exam: 1y ago, sched for next week Last CRC screen: never Last Mammo: limited last year (chest pain, stress-related, now resolved, no findings on mammo) Last Pap: 2017  Endorses hot flashes, having them during exam today and would like to defer breast and pelvic exam to future visit.  LMP about 2 weeks ago, still has periods regularly, though the timing seems to vary by a couple days.  Complaining of recent right eye itching and redness.  Swims regularly without goggles.  No known history of allergies  She tells me that her last provider put her on rosuvastatin 20 mg for HLD without ramp-up, and she was unable to tolerate the medication.  She has not taken her medication for about 3 months now, curious to recheck lipids today.  She is willing to try another statin medication, especially if at a lower dose.   Health Maintenance Due  Topic Date Due   HIV Screening  Never done   Colonoscopy  Never done   PAP SMEAR-Modifier  08/20/2018   Zoster Vaccines- Shingrix (1 of 2) Never done    Immunization History  Administered Date(s) Administered   Influenza,inj,Quad PF,6+ Mos 11/15/2014   Influenza-Unspecified 11/09/2015, 03/27/2020   Tdap 03/10/2008, 09/08/2019       Medication list has been reviewed and updated.  No outpatient medications have been marked as taking for the 09/24/22 encounter (Office Visit) with Remo Lipps, PA.     Review of Systems  Constitutional:  Negative for appetite change, fatigue and unexpected weight change.  HENT:  Negative for  hearing loss and mouth sores.   Eyes:  Positive for itching. Negative for visual disturbance.  Respiratory:  Negative for cough, chest tightness, shortness of breath and wheezing.   Cardiovascular:  Negative for chest pain, palpitations and leg swelling.  Gastrointestinal:  Negative for abdominal distention, abdominal pain, blood in stool, constipation and diarrhea.  Endocrine: Negative for polydipsia, polyphagia and polyuria.       Hot flashes  Genitourinary:  Negative for difficulty urinating, dysuria, hematuria, menstrual problem, vaginal bleeding and vaginal discharge.  Musculoskeletal:  Negative for arthralgias, back pain and gait problem.  Skin:  Negative for rash and wound.  Neurological:  Negative for dizziness, syncope, weakness, numbness and headaches.  Psychiatric/Behavioral:  Negative for behavioral problems and sleep disturbance.     There are no problems to display for this patient.   Allergies  Allergen Reactions   Codeine Shortness Of Breath   Morphine And Codeine Other (See Comments)    Seizures    Sulfa Antibiotics Rash    Immunization History  Administered Date(s) Administered   Influenza,inj,Quad PF,6+ Mos 11/15/2014   Influenza-Unspecified 11/09/2015, 03/27/2020   Tdap 03/10/2008, 09/08/2019    Past Surgical History:  Procedure Laterality Date   HERNIA REPAIR     TONSILLECTOMY     TUBAL LIGATION      Social History   Tobacco Use   Smoking status: Former    Current packs/day: 1.00    Average  packs/day: 1 pack/day for 16.1 years (16.1 ttl pk-yrs)    Types: Cigarettes    Start date: 03/10/1988   Smokeless tobacco: Never  Vaping Use   Vaping status: Never Used  Substance Use Topics   Alcohol use: No   Drug use: Never    Family History  Problem Relation Age of Onset   Heart disease Mother    Cancer Father    Breast cancer Maternal Grandmother        mat great gm        09/24/2022    1:58 PM  GAD 7 : Generalized Anxiety Score  Nervous,  Anxious, on Edge 1  Control/stop worrying 2  Worry too much - different things 2  Trouble relaxing 2  Restless 0  Easily annoyed or irritable 1  Afraid - awful might happen 1  Total GAD 7 Score 9  Anxiety Difficulty Somewhat difficult       09/24/2022    1:58 PM  Depression screen PHQ 2/9  Decreased Interest 1  Down, Depressed, Hopeless 0  PHQ - 2 Score 1  Altered sleeping 2  Tired, decreased energy 2  Change in appetite 3  Feeling bad or failure about yourself  0  Trouble concentrating 1  Moving slowly or fidgety/restless 0  Suicidal thoughts 0  PHQ-9 Score 9  Difficult doing work/chores Somewhat difficult    BP Readings from Last 3 Encounters:  09/24/22 110/78  09/11/21 119/70  08/20/21 110/64    Wt Readings from Last 3 Encounters:  09/24/22 147 lb (66.7 kg)  08/20/21 146 lb (66.2 kg)  08/01/21 150 lb (68 kg)    BP 110/78   Pulse 90   Ht 5\' 1"  (1.549 m)   Wt 147 lb (66.7 kg)   LMP 09/11/2022 (Exact Date) Comment: regular  SpO2 95%   BMI 27.78 kg/m   Physical Exam Vitals and nursing note reviewed. Exam conducted with a chaperone present.  Constitutional:      Appearance: Normal appearance. She is well-groomed.  HENT:     Ears:     Comments: EAC clear bilaterally with good view of TM which is without effusion or erythema.     Nose: Nose normal.     Mouth/Throat:     Mouth: Mucous membranes are moist. No oral lesions.     Dentition: Normal dentition.     Pharynx: Uvula midline. No posterior oropharyngeal erythema.  Eyes:     General: Vision grossly intact.     Extraocular Movements: Extraocular movements intact.     Conjunctiva/sclera: Conjunctivae normal.     Pupils: Pupils are equal, round, and reactive to light.  Neck:     Thyroid: No thyroid mass or thyromegaly.  Cardiovascular:     Rate and Rhythm: Normal rate and regular rhythm.     Heart sounds: S1 normal and S2 normal. No murmur heard.    No friction rub. No gallop.     Comments: Pulses  2+ at radial, PT, DP bilaterally. No carotid bruit. No peripheral edema Pulmonary:     Effort: Pulmonary effort is normal.     Breath sounds: Normal breath sounds.  Chest:     Comments: Deferred Abdominal:     General: Bowel sounds are normal.     Palpations: Abdomen is soft. There is no mass.     Tenderness: There is no abdominal tenderness.  Genitourinary:    Comments: Deferred Musculoskeletal:     Comments: Full ROM with strength 5/5 bilateral upper  and lower extremities  Lymphadenopathy:     Cervical: No cervical adenopathy.  Skin:    General: Skin is warm.     Capillary Refill: Capillary refill takes less than 2 seconds.     Findings: No lesion or rash.  Neurological:     Mental Status: She is alert and oriented to person, place, and time.     Cranial Nerves: Cranial nerves 2-12 are intact.     Gait: Gait is intact.  Psychiatric:        Mood and Affect: Mood and affect normal.        Behavior: Behavior normal.     Recent Labs     Component Value Date/Time   NA 139 08/20/2021 1634   K 4.0 08/20/2021 1634   CL 102 08/20/2021 1634   CO2 19 (L) 08/20/2021 1634   GLUCOSE 95 08/20/2021 1634   GLUCOSE 105 (H) 08/01/2021 2132   BUN 16 08/20/2021 1634   CREATININE 0.75 08/20/2021 1634   CALCIUM 9.2 08/20/2021 1634   PROT 6.9 02/05/2017 2252   ALBUMIN 4.1 03/28/2020 0000   AST 21 03/28/2020 0000   ALT 23 03/28/2020 0000   ALKPHOS 63 03/28/2020 0000   BILITOT 0.5 02/05/2017 2252   GFRNONAA >60 08/01/2021 2132   GFRAA >60 02/05/2017 2252    Lab Results  Component Value Date   WBC 8.2 08/01/2021   HGB 13.1 08/01/2021   HCT 40.1 08/01/2021   MCV 85.5 08/01/2021   PLT 292 08/01/2021   No results found for: "HGBA1C" Lab Results  Component Value Date   CHOL 255 (A) 03/28/2020   HDL 40 03/28/2020   LDLCALC 178 03/28/2020   TRIG 186 (A) 03/28/2020   Lab Results  Component Value Date   TSH 0.583 08/20/2021     Assessment and Plan:  1. Annual physical  exam Healthy patient with no abnormalities on exam. Encouraged healthy lifestyle including regular physical activity and consumption of whole fruits and vegetables.  We will reschedule pap and breast exam for about 2 weeks from now.   Check baseline labs   - CBC with Differential/Platelet - Comprehensive metabolic panel - TSH  2. Mixed hyperlipidemia Repeat nonfasting lipids today - Lipid panel  3. Colon cancer screening Referring for initial CRC screen.  Colonoscopy likely preferred given heavy smoking history. - Ambulatory referral to Gastroenterology  4. Screening for HIV (human immunodeficiency virus) One-time HIV screen today - HIV Antibody (routine testing w rflx)  5. Encounter for screening mammogram for breast cancer Ordering mammogram.  - MM 3D SCREENING MAMMOGRAM BILATERAL BREAST   Return in about 2 weeks (around 10/08/2022) for OV breast/pap.    Alvester Morin, PA-C, DMSc, Nutritionist [facility] [phone]

## 2022-09-25 LAB — COMPREHENSIVE METABOLIC PANEL
ALT: 22 IU/L (ref 0–32)
AST: 23 IU/L (ref 0–40)
Albumin: 4.6 g/dL (ref 3.8–4.9)
Alkaline Phosphatase: 86 IU/L (ref 44–121)
BUN/Creatinine Ratio: 14 (ref 9–23)
BUN: 9 mg/dL (ref 6–24)
Bilirubin Total: 0.3 mg/dL (ref 0.0–1.2)
CO2: 21 mmol/L (ref 20–29)
Calcium: 10 mg/dL (ref 8.7–10.2)
Chloride: 102 mmol/L (ref 96–106)
Creatinine, Ser: 0.64 mg/dL (ref 0.57–1.00)
Globulin, Total: 2.4 g/dL (ref 1.5–4.5)
Glucose: 92 mg/dL (ref 70–99)
Potassium: 4.2 mmol/L (ref 3.5–5.2)
Sodium: 139 mmol/L (ref 134–144)
Total Protein: 7 g/dL (ref 6.0–8.5)
eGFR: 107 mL/min/{1.73_m2} (ref >59)

## 2022-09-25 LAB — LIPID PANEL
Chol/HDL Ratio: 5.2 ratio — ABNORMAL HIGH (ref 0.0–4.4)
Cholesterol, Total: 225 mg/dL — ABNORMAL HIGH (ref 100–199)
HDL: 43 mg/dL (ref >39)
LDL Chol Calc (NIH): 131 mg/dL — ABNORMAL HIGH (ref 0–99)
Triglycerides: 286 mg/dL — ABNORMAL HIGH (ref 0–149)
VLDL Cholesterol Cal: 51 mg/dL — ABNORMAL HIGH (ref 5–40)

## 2022-09-25 LAB — CBC WITH DIFFERENTIAL/PLATELET
Basophils Absolute: 0.1 10*3/uL (ref 0.0–0.2)
Basos: 1 %
EOS (ABSOLUTE): 0.1 10*3/uL (ref 0.0–0.4)
Eos: 2 %
Hematocrit: 40.3 % (ref 34.0–46.6)
Hemoglobin: 13 g/dL (ref 11.1–15.9)
Immature Grans (Abs): 0 10*3/uL (ref 0.0–0.1)
Immature Granulocytes: 0 %
Lymphocytes Absolute: 1.1 10*3/uL (ref 0.7–3.1)
Lymphs: 20 %
MCH: 26.7 pg (ref 26.6–33.0)
MCHC: 32.3 g/dL (ref 31.5–35.7)
MCV: 83 fL (ref 79–97)
Monocytes Absolute: 0.6 10*3/uL (ref 0.1–0.9)
Monocytes: 12 %
Neutrophils Absolute: 3.4 10*3/uL (ref 1.4–7.0)
Neutrophils: 65 %
Platelets: 292 10*3/uL (ref 150–450)
RBC: 4.87 x10E6/uL (ref 3.77–5.28)
RDW: 14 % (ref 11.7–15.4)
WBC: 5.3 10*3/uL (ref 3.4–10.8)

## 2022-09-25 LAB — HIV ANTIBODY (ROUTINE TESTING W REFLEX): HIV Screen 4th Generation wRfx: NONREACTIVE

## 2022-09-25 LAB — TSH: TSH: 1.24 u[IU]/mL (ref 0.450–4.500)

## 2022-09-26 ENCOUNTER — Other Ambulatory Visit: Payer: Self-pay | Admitting: Physician Assistant

## 2022-09-26 ENCOUNTER — Encounter: Payer: Self-pay | Admitting: Physician Assistant

## 2022-09-26 DIAGNOSIS — I2510 Atherosclerotic heart disease of native coronary artery without angina pectoris: Secondary | ICD-10-CM | POA: Insufficient documentation

## 2022-09-26 DIAGNOSIS — E7800 Pure hypercholesterolemia, unspecified: Secondary | ICD-10-CM

## 2022-09-26 MED ORDER — ATORVASTATIN CALCIUM 10 MG PO TABS
10.0000 mg | ORAL_TABLET | Freq: Every day | ORAL | 1 refills | Status: AC
Start: 2022-09-26 — End: ?

## 2022-10-03 ENCOUNTER — Encounter: Payer: Self-pay | Admitting: *Deleted

## 2022-10-06 ENCOUNTER — Ambulatory Visit
Admission: RE | Admit: 2022-10-06 | Discharge: 2022-10-06 | Disposition: A | Discharge status: Home / Self Care | Payer: No Typology Code available for payment source | Source: Ambulatory Visit | Attending: Physician Assistant | Admitting: Physician Assistant

## 2022-10-06 DIAGNOSIS — Z1231 Encounter for screening mammogram for malignant neoplasm of breast: Secondary | ICD-10-CM | POA: Insufficient documentation

## 2022-10-07 ENCOUNTER — Telehealth: Payer: Self-pay

## 2022-10-07 NOTE — Telephone Encounter (Signed)
Returned patients call to schedule her colonoscopy.  LVM for pt to return my call.   Thanks, Michelle, CMA 

## 2022-10-08 ENCOUNTER — Ambulatory Visit: Payer: No Typology Code available for payment source | Admitting: Physician Assistant

## 2022-10-08 ENCOUNTER — Other Ambulatory Visit: Payer: Self-pay | Admitting: Physician Assistant

## 2022-10-08 ENCOUNTER — Ambulatory Visit: Payer: Self-pay | Admitting: Physician Assistant

## 2022-10-08 DIAGNOSIS — R928 Other abnormal and inconclusive findings on diagnostic imaging of breast: Secondary | ICD-10-CM

## 2022-10-08 DIAGNOSIS — N63 Unspecified lump in unspecified breast: Secondary | ICD-10-CM

## 2022-10-10 ENCOUNTER — Ambulatory Visit
Admission: RE | Admit: 2022-10-10 | Discharge: 2022-10-10 | Disposition: A | Discharge status: Home / Self Care | Payer: No Typology Code available for payment source | Source: Ambulatory Visit | Attending: Physician Assistant | Admitting: Physician Assistant

## 2022-10-10 DIAGNOSIS — N63 Unspecified lump in unspecified breast: Secondary | ICD-10-CM

## 2022-10-10 DIAGNOSIS — R928 Other abnormal and inconclusive findings on diagnostic imaging of breast: Secondary | ICD-10-CM | POA: Insufficient documentation

## 2022-10-14 ENCOUNTER — Telehealth: Payer: Self-pay

## 2022-10-14 ENCOUNTER — Other Ambulatory Visit: Payer: Self-pay

## 2022-10-14 DIAGNOSIS — Z1211 Encounter for screening for malignant neoplasm of colon: Secondary | ICD-10-CM

## 2022-10-14 MED ORDER — NA SULFATE-K SULFATE-MG SULF 17.5-3.13-1.6 GM/177ML PO SOLN: 1.0000 | Freq: Once | ORAL | 0 refills | Status: AC

## 2022-10-14 NOTE — Telephone Encounter (Signed)
Gastroenterology Pre-Procedure Review  Request Date: 11/21/22 Requesting Physician: Dr. Servando Snare  PATIENT REVIEW QUESTIONS: The patient responded to the following health history questions as indicated:    1. Are you having any GI issues? no 2. Do you have a personal history of Polyps? no 3. Do you have a family history of Colon Cancer or Polyps? no 4. Diabetes Mellitus? no 5. Joint replacements in the past 12 months?no 6. Major health problems in the past 3 months?no 7. Any artificial heart valves, MVP, or defibrillator?no    MEDICATIONS & ALLERGIES:    Patient reports the following regarding taking any anticoagulation/antiplatelet therapy:   Plavix, Coumadin, Eliquis, Xarelto, Lovenox, Pradaxa, Brilinta, or Effient? no Aspirin? no  Patient confirms/reports the following medications:  Current Outpatient Medications  Medication Sig Dispense Refill   atorvastatin (LIPITOR) 10 MG tablet Take 1 tablet (10 mg total) by mouth daily. 30 tablet 1   azithromycin (ZITHROMAX) 250 MG tablet TAKE 2 TABLETS BY MOUTH ON DAY 1, THEN TAKE 1 TABLET DAILY ON DAYS 2-5     No current facility-administered medications for this visit.    Patient confirms/reports the following allergies:  Allergies  Allergen Reactions   Codeine Shortness Of Breath   Morphine And Codeine Other (See Comments)    Seizures    Sulfa Antibiotics Rash    No orders of the defined types were placed in this encounter.   AUTHORIZATION INFORMATION Primary Insurance: 1D#: Group #:  Secondary Insurance: 1D#: Group #:  SCHEDULE INFORMATION: Date: 11/21/22 Time: Location: [facility]

## 2022-10-24 ENCOUNTER — Ambulatory Visit: Payer: No Typology Code available for payment source | Admitting: Physician Assistant

## 2022-10-27 ENCOUNTER — Telehealth: Payer: Self-pay | Admitting: Physician Assistant

## 2022-10-27 NOTE — Telephone Encounter (Signed)
Called patient she stated that she's not comfortable with seeing a female provider stated that we remove the pcp from her profile will continue to look elsewhere.

## 2022-10-27 NOTE — Telephone Encounter (Signed)
Pt is calling to request a change in medical provider to a female. Please advise CB- [phone]

## 2022-10-27 NOTE — Telephone Encounter (Signed)
Please call pt let her know we don't switch providers in the office.   KP

## 2022-11-07 ENCOUNTER — Encounter: Payer: Self-pay | Admitting: Anesthesiology

## 2022-11-13 ENCOUNTER — Encounter: Payer: Self-pay | Admitting: Gastroenterology

## 2022-11-20 ENCOUNTER — Telehealth: Payer: Self-pay | Admitting: Gastroenterology

## 2022-11-20 ENCOUNTER — Telehealth: Payer: Self-pay

## 2022-11-20 NOTE — Telephone Encounter (Signed)
Patient contacted office stated that she is having a running nose, and congestion.  Took COVID test it is Negative.  Colonoscopy is scheduled for tomorrow at [facility] with Dr. Servando Snare.  Advised that since she does have cold we will need to reschedule procedure.  She agreed.  Colonoscopy has been rescheduled to 11/28/22 at [facility].  Boneta Lucks at [facility] has been notified.  Thanks  [place], [place]

## 2022-11-20 NOTE — Telephone Encounter (Signed)
Patient called in and left her name on the voicemail. I called the patient back and she is not feeling well and she wanted to know if she has to cancel her procedure.

## 2022-11-25 ENCOUNTER — Telehealth: Payer: Self-pay

## 2022-11-25 NOTE — Telephone Encounter (Signed)
Patient has requested to cancel 11/28/22 colonoscopy with Dr. Servando Snare at [facility] due to she is still getting over a respiratory infection.  Thanks,  [place], [place]

## 2022-11-25 NOTE — Telephone Encounter (Signed)
Pt requesting call back to  reschedule  procedure

## 2022-11-26 ENCOUNTER — Other Ambulatory Visit: Payer: Self-pay

## 2022-11-26 ENCOUNTER — Emergency Department (HOSPITAL_COMMUNITY)
Admission: EM | Admit: 2022-11-26 | Discharge: 2022-11-26 | Disposition: A | Discharge status: Home / Self Care | Payer: No Typology Code available for payment source | Attending: Emergency Medicine | Admitting: Emergency Medicine

## 2022-11-26 DIAGNOSIS — H5789 Other specified disorders of eye and adnexa: Secondary | ICD-10-CM | POA: Insufficient documentation

## 2022-11-26 MED ORDER — OLOPATADINE HCL 0.1 % OP SOLN: 1.0000 [drp] | Freq: Two times a day (BID) | OPHTHALMIC | 0 refills | Status: AC

## 2022-11-26 NOTE — ED Provider Notes (Signed)
   [facility] [facility]  Provider Note  CSN: [id number] Arrival date & time: 11/26/22 0130  History Chief Complaint  Patient presents with   Eye Problem    Cristina Aguilar is a 52 y.o. female here for eye redness. She reports she was diagnosed with conjunctivitis at [facility] about 4-5 days ago. Given gentamicin drops which seemed to be helping but she used the drops tonight before going to bed and noticed the area around her L>R eye became puffy and red. It has improved some since then. No drainage. No pain, no blurry vision.    Home Medications Prior to Admission medications   Medication Sig Start Date End Date Taking? Authorizing Provider  olopatadine (PATANOL) 0.1 % ophthalmic solution Place 1 drop into both eyes 2 (two) times daily for 5 days. 11/26/22 12/01/22 Yes Pollyann Savoy, MD  atorvastatin (LIPITOR) 10 MG tablet Take 1 tablet (10 mg total) by mouth daily. 09/26/22   Remo Lipps, PA  cetirizine (ZYRTEC) 10 MG tablet Take 10 mg by mouth daily as needed for allergies.    [provider]     Allergies    Codeine, Morphine and codeine, and Sulfa antibiotics   Review of Systems   Review of Systems Please see HPI for pertinent positives and negatives  Physical Exam BP (!) 142/81   Pulse 65   Resp 17   LMP 11/03/2022 (Approximate)   SpO2 99%   Physical Exam Vitals and nursing note reviewed.  HENT:     Head: Normocephalic.     Nose: Nose normal.  Eyes:     Extraocular Movements: Extraocular movements intact.     Conjunctiva/sclera: Conjunctivae normal.     Comments: No conjunctival injection or drainage. Anterior chambers are clear. She has mild periorbital edema without erythema or induration.   Pulmonary:     Effort: Pulmonary effort is normal.  Musculoskeletal:        General: Normal range of motion.     Cervical back: Neck supple.  Skin:    Findings: No rash (on exposed skin).  Neurological:     Mental Status:  She is alert and oriented to person, place, and time.  Psychiatric:        Mood and Affect: Mood normal.     ED Results / Procedures / Treatments   EKG None  Procedures Procedures  Medications Ordered in the ED Medications - No data to display  Initial Impression and Plan  Patient here with likely allergic reaction, possibly from gentamicin eye drops. No signs of infection. Advised to stop the eye drops, use oral benadryl. Rx for Patanol drops and follow up with her eye doctor in 2-3 days for check.   ED Course       MDM Rules/Calculators/A&P Medical Decision Making Problems Addressed: Eye irritation: acute illness or injury  Risk Prescription drug management.     Final Clinical Impression(s) / ED Diagnoses Final diagnoses:  Eye irritation    Rx / DC Orders ED Discharge Orders          Ordered    olopatadine (PATANOL) 0.1 % ophthalmic solution  2 times daily        11/26/22 0241             Pollyann Savoy, MD 11/26/22 [phone]

## 2022-11-26 NOTE — ED Triage Notes (Signed)
Pt was recently dx with conjunctivitis in both eyes, started taking Gentamicin drops on Thursday. Woke up to bilateral eye swelling, is worried she may be having a reaction to medication

## 2022-11-28 ENCOUNTER — Ambulatory Visit
Admission: RE | Admit: 2022-11-28 | Payer: No Typology Code available for payment source | Source: Home / Self Care | Admitting: Gastroenterology

## 2022-11-28 HISTORY — DX: Family history of other specified conditions: Z84.89

## 2022-11-28 HISTORY — DX: Migraine, unspecified, not intractable, without status migrainosus: G43.909

## 2022-11-28 SURGERY — COLONOSCOPY WITH PROPOFOL
Anesthesia: Choice

## 2022-12-07 ENCOUNTER — Other Ambulatory Visit: Payer: Self-pay | Admitting: Physician Assistant

## 2022-12-07 DIAGNOSIS — E7800 Pure hypercholesterolemia, unspecified: Secondary | ICD-10-CM

## 2022-12-08 NOTE — Telephone Encounter (Signed)
Requested Prescriptions  Pending Prescriptions Disp Refills   atorvastatin (LIPITOR) 10 MG tablet [Pharmacy Med Name: atorvastatin 10 mg tablet] 30 tablet 1    Sig: TAKE ONE TABLET BY MOUTH EVERY DAY     Cardiovascular:  Antilipid - Statins Failed - 12/07/2022  8:04 AM      Failed - Lipid Panel in normal range within the last 12 months    Cholesterol, Total  Date Value Ref Range Status  09/24/2022 225 (H) 100 - 199 mg/dL Final   LDL Chol Calc (NIH)  Date Value Ref Range Status  09/24/2022 131 (H) 0 - 99 mg/dL Final   HDL  Date Value Ref Range Status  09/24/2022 43 >39 mg/dL Final   Triglycerides  Date Value Ref Range Status  09/24/2022 286 (H) 0 - 149 mg/dL Final         Passed - Patient is not pregnant      Passed - Valid encounter within last 12 months    Recent Outpatient Visits           2 months ago Annual physical exam   [facility] at [facility], [place], [place]

## 2023-04-30 ENCOUNTER — Telehealth: Payer: Self-pay

## 2023-04-30 ENCOUNTER — Ambulatory Visit: Payer: No Typology Code available for payment source | Admitting: Pediatrics

## 2023-04-30 NOTE — Telephone Encounter (Signed)
 Called patient to inform of need to change to virtual appointment this afternoon due to inclement weather but unable to leave message as voicemail is full.   Also sending message via mychart. If patient is not able to do virtual and calls back please assist with rescheduling.

## 2023-05-27 ENCOUNTER — Ambulatory Visit: Admitting: Pediatrics

## 2023-05-27 VITALS — BP 130/81 | HR 83 | Ht 61.0 in | Wt 151.2 lb

## 2023-05-27 DIAGNOSIS — J302 Other seasonal allergic rhinitis: Secondary | ICD-10-CM | POA: Diagnosis not present

## 2023-05-27 DIAGNOSIS — Z1330 Encounter for screening examination for mental health and behavioral disorders, unspecified: Secondary | ICD-10-CM

## 2023-05-27 DIAGNOSIS — Z7689 Persons encountering health services in other specified circumstances: Secondary | ICD-10-CM

## 2023-05-27 DIAGNOSIS — N951 Menopausal and female climacteric states: Secondary | ICD-10-CM

## 2023-05-27 DIAGNOSIS — Z1211 Encounter for screening for malignant neoplasm of colon: Secondary | ICD-10-CM

## 2023-05-27 DIAGNOSIS — Z131 Encounter for screening for diabetes mellitus: Secondary | ICD-10-CM | POA: Diagnosis not present

## 2023-05-27 DIAGNOSIS — E7800 Pure hypercholesterolemia, unspecified: Secondary | ICD-10-CM | POA: Diagnosis not present

## 2023-05-27 DIAGNOSIS — Z13220 Encounter for screening for lipoid disorders: Secondary | ICD-10-CM

## 2023-05-27 MED ORDER — CETIRIZINE HCL 10 MG PO TABS
10.0000 mg | ORAL_TABLET | Freq: Every day | ORAL | 3 refills | Status: AC
Start: 2023-05-27 — End: ?

## 2023-05-27 MED ORDER — PAROXETINE HCL 10 MG PO TABS
10.0000 mg | ORAL_TABLET | Freq: Every day | ORAL | 1 refills | Status: DC
Start: 2023-05-27 — End: 2023-10-21

## 2023-05-27 NOTE — Progress Notes (Signed)
 Establish Care Note  BP 130/81 (BP Location: Right Arm, Patient Position: Sitting, Cuff Size: Large)   Pulse 83   Ht 5\' 1"  (1.549 m)   Wt 151 lb 3.2 oz (68.6 kg)   SpO2 97%   BMI 28.57 kg/m    Subjective:    Patient ID: Cristina Aguilar, female    DOB: 03-Dec-1970, 53 y.o.   MRN: [id number] 3`44 HPI: Cristina Aguilar is a 53 y.o. female  Chief Complaint  Patient presents with   Establish Care   Menopause    Would like to know if she's going through menopause, sweating, can't sleep, love for food, panicc attacks    Medication Refill    Atorvastatin   Annual Exam    Would like to have a mammogram, Pap when needed   labwork    Would like a complete panel for overall health    Establishing care, the following was discussed today:  Discussed the use of AI scribe software for clinical note transcription with the patient, who gave verbal consent to proceed.  History of Present Illness   Cristina Aguilar is a 53 year old female who presents with menopause symptoms and medication management.  She is experiencing significant menopause symptoms, including severe hot flashes that occur without any apparent trigger, most intense from noon to around 9 PM. She also had a single episode of what she described as a panic attack, characterized by a strong urge to leave her house and chest discomfort. Her mood has been irritable, and she lacks patience, which she attributes to her current symptoms. These symptoms have been present since her early 59s and have remained consistent over time. Her last menstrual period was in February, and she missed one period in December, indicating she is likely in perimenopause.  She reports weight gain, with her weight increasing from her usual 145-150 pounds to 170 pounds. Despite joining a gym, she lacks the energy to attend regularly and struggles with food cravings. She has tried self-control and over-the-counter remedies without success.  She has a  history of being on rosuvastatin, which caused severe headaches, leading to a change in medication. She is currently on atorvastatin 10 mg, which she tolerates well. She has not had recent blood work to assess the effectiveness of this medication.  She has a history of smoking but has since quit. She is prone to bronchitis, which she manages with cetirizine and Flonase to prevent exacerbations. Bronchitis episodes typically require treatment with a Z-Pak and sometimes prednisone.  Her family history includes her mother having thyroid issues and heart problems, and her mother passed away from these conditions. She has a personal history of depression diagnosed years ago, which she did not treat with medication due to adverse effects from Wellbutrin. She has experienced significant personal loss, including the deaths of her sisters and parents, and her daughter undergoing open-heart surgery.        Current Outpatient Medications on File Prior to Visit  Medication Sig Dispense Refill   atorvastatin (LIPITOR) 10 MG tablet TAKE ONE TABLET BY MOUTH EVERY DAY 30 tablet 1   No current facility-administered medications on file prior to visit.    #HM Will review HM records and updated as needed.  Relevant past medical, surgical, family and social history reviewed and updated as indicated. Interim medical history since our last visit reviewed. Allergies and medications reviewed and updated.  ROS per HPI unless specifically indicated above     Objective:  BP 130/81 (BP Location: Right Arm, Patient Position: Sitting, Cuff Size: Large)   Pulse 83   Ht 5\' 1"  (1.549 m)   Wt 151 lb 3.2 oz (68.6 kg)   SpO2 97%   BMI 28.57 kg/m   Wt Readings from Last 3 Encounters:  05/27/23 151 lb 3.2 oz (68.6 kg)  09/24/22 147 lb (66.7 kg)  08/20/21 146 lb (66.2 kg)     Physical Exam Constitutional:      Appearance: Normal appearance.  HENT:     Head: Normocephalic and atraumatic.  Eyes:     Pupils:  Pupils are equal, round, and reactive to light.  Cardiovascular:     Rate and Rhythm: Normal rate and regular rhythm.     Pulses: Normal pulses.     Heart sounds: Normal heart sounds.  Pulmonary:     Effort: Pulmonary effort is normal.     Breath sounds: Normal breath sounds.  Abdominal:     General: Abdomen is flat.     Palpations: Abdomen is soft.  Musculoskeletal:        General: Normal range of motion.     Cervical back: Normal range of motion.  Skin:    General: Skin is warm and dry.     Capillary Refill: Capillary refill takes less than 2 seconds.  Neurological:     General: No focal deficit present.     Mental Status: She is alert. Mental status is at baseline.  Psychiatric:        Mood and Affect: Mood normal.        Behavior: Behavior normal.         05/27/2023    4:27 PM 09/24/2022    1:58 PM  Depression screen PHQ 2/9  Decreased Interest 1 1  Down, Depressed, Hopeless 0 0  PHQ - 2 Score 1 1  Altered sleeping 2 2  Tired, decreased energy 3 2  Change in appetite 3 3  Feeling bad or failure about yourself  0 0  Trouble concentrating 1 1  Moving slowly or fidgety/restless 0 0  Suicidal thoughts 0 0  PHQ-9 Score 10 9  Difficult doing work/chores  Somewhat difficult        05/27/2023    4:27 PM 09/24/2022    1:58 PM  GAD 7 : Generalized Anxiety Score  Nervous, Anxious, on Edge 1 1  Control/stop worrying 3 2  Worry too much - different things 1 2  Trouble relaxing 3 2  Restless 0 0  Easily annoyed or irritable 1 1  Afraid - awful might happen 0 1  Total GAD 7 Score 9 9  Anxiety Difficulty  Somewhat difficult       Assessment & Plan:  Assessment & Plan   Perimenopause Assessment & Plan: Symptoms include hot flashes, irritability, weight gain, and irregular cycles. Declined hormonal treatments due to cancer risk concerns. Discussed and agreed on SSRIs, specifically Paxil, for symptom management. - Prescribe low-dose Paxil for perimenopausal  symptoms. - Follow up in 2 weeks to assess symptom improvement and medication tolerance.   Hypercholesterolemia Assessment & Plan: Tolerates atorvastatin 10 mg well after adverse effects on high-dose rosuvastatin. Plan to assess effectiveness with blood work. - Order blood work to check cholesterol levels. - Evaluate atorvastatin dosage based on blood work results.  Orders: -     Lipid panel  Seasonal allergies Assessment & Plan: Managed with cetirizine and Flonase. Predisposed to bronchitis, typically requiring a Z-Pak and sometimes prednisone. - Prescribe cetirizine  for allergy management.  Orders: -     PARoxetine HCl; Take 1 tablet (10 mg total) by mouth daily.  Dispense: 30 tablet; Refill: 1 -     Cetirizine HCl; Take 1 tablet (10 mg total) by mouth daily.  Dispense: 30 tablet; Refill: 3  Diabetes mellitus screening -     Hemoglobin A1c  Screen for colon cancer -     Cologuard  Encounter to establish care Reviewed available patient record including history, medications, problem list. HM updated as able. Will review and/or request outside records (if applicable) and will fill remaining HM gaps as needed at follow up visit. Due for routine screenings: Pap smear, colon cancer screening, and mammogram. Prefers Cologuard for colon cancer screening. Mammogram was normal in August, due for repeat in one year. - Perform Pap smear during the next visit. - Order Cologuard for colon cancer screening. - Schedule mammogram for August.   Encounter for behavioral health screening As part of their intake evaluation, the patient was screened for depression, anxiety.  PHQ9 SCORE 10, GAD7 SCORE 9. Screening results positive for tested conditions. See plan under problem/diagnosis above. Suspect related to perimenop.   Follow up plan: Return in about 2 weeks (around 06/10/2023) for HTN, menopause, pap.  Jackolyn Confer, MD

## 2023-05-27 NOTE — Patient Instructions (Addendum)
 For perimenopause: - start daily paxil 10mg   Good to meet you! Welcome to [facility]!  As your primary care doctor, I look forward to working with you to help you reach your health goals.  Please be aware of a couple of logistical items: - If you message me on mychart, it may take me 1-2 business days to get back to you. This is for non-urgent messaging.  - If you require urgent clinical attention, please call the clinic or present to urgent care/emergency room - If you have labs, I typically will send a message about them in 1-2 business days. - I am not here on Mondays, otherwise will be available from Tuesday-Friday during 8a-5pm.

## 2023-05-28 LAB — LIPID PANEL
Chol/HDL Ratio: 5.4 ratio — ABNORMAL HIGH (ref 0.0–4.4)
Cholesterol, Total: 209 mg/dL — ABNORMAL HIGH (ref 100–199)
HDL: 39 mg/dL — ABNORMAL LOW (ref >39)
LDL Chol Calc (NIH): 121 mg/dL — ABNORMAL HIGH (ref 0–99)
Triglycerides: 280 mg/dL — ABNORMAL HIGH (ref 0–149)
VLDL Cholesterol Cal: 49 mg/dL — ABNORMAL HIGH (ref 5–40)

## 2023-05-28 LAB — HEMOGLOBIN A1C
Est. average glucose Bld gHb Est-mCnc: 120 mg/dL
Hgb A1c MFr Bld: 5.8 % — ABNORMAL HIGH (ref 4.8–5.6)

## 2023-05-29 ENCOUNTER — Encounter: Payer: Self-pay | Admitting: Pediatrics

## 2023-05-29 DIAGNOSIS — R7303 Prediabetes: Secondary | ICD-10-CM | POA: Insufficient documentation

## 2023-06-02 ENCOUNTER — Encounter: Payer: Self-pay | Admitting: Pediatrics

## 2023-06-02 DIAGNOSIS — J302 Other seasonal allergic rhinitis: Secondary | ICD-10-CM | POA: Insufficient documentation

## 2023-06-02 DIAGNOSIS — N951 Menopausal and female climacteric states: Secondary | ICD-10-CM | POA: Insufficient documentation

## 2023-06-02 NOTE — Assessment & Plan Note (Signed)
 Symptoms include hot flashes, irritability, weight gain, and irregular cycles. Declined hormonal treatments due to cancer risk concerns. Discussed and agreed on SSRIs, specifically Paxil, for symptom management. - Prescribe low-dose Paxil for perimenopausal symptoms. - Follow up in 2 weeks to assess symptom improvement and medication tolerance.

## 2023-06-02 NOTE — Assessment & Plan Note (Signed)
 Tolerates atorvastatin 10 mg well after adverse effects on high-dose rosuvastatin. Plan to assess effectiveness with blood work. - Order blood work to check cholesterol levels. - Evaluate atorvastatin dosage based on blood work results.

## 2023-06-02 NOTE — Assessment & Plan Note (Signed)
 Managed with cetirizine and Flonase. Predisposed to bronchitis, typically requiring a Z-Pak and sometimes prednisone. - Prescribe cetirizine for allergy management.

## 2023-06-14 ENCOUNTER — Other Ambulatory Visit: Payer: Self-pay | Admitting: Physician Assistant

## 2023-06-14 DIAGNOSIS — E7800 Pure hypercholesterolemia, unspecified: Secondary | ICD-10-CM

## 2023-06-16 ENCOUNTER — Ambulatory Visit: Admitting: Pediatrics

## 2023-06-16 NOTE — Telephone Encounter (Signed)
 Requested medications are due for refill today.  yes  Requested medications are on the active medications list.  yes  Last refill. 12/08/2022 #30 1 rf  Future visit scheduled.   no  Notes to clinic.  Rx signed by Tillie Fantasia.     Requested Prescriptions  Pending Prescriptions Disp Refills   atorvastatin (LIPITOR) 10 MG tablet [Pharmacy Med Name: atorvastatin 10 mg tablet] 30 tablet 1    Sig: TAKE ONE TABLET BY MOUTH EVERY DAY     Cardiovascular:  Antilipid - Statins Failed - 06/16/2023  8:07 AM      Failed - Lipid Panel in normal range within the last 12 months    Cholesterol, Total  Date Value Ref Range Status  05/27/2023 209 (H) 100 - 199 mg/dL Final   LDL Chol Calc (NIH)  Date Value Ref Range Status  05/27/2023 121 (H) 0 - 99 mg/dL Final   HDL  Date Value Ref Range Status  05/27/2023 39 (L) >39 mg/dL Final   Triglycerides  Date Value Ref Range Status  05/27/2023 280 (H) 0 - 149 mg/dL Final         Passed - Patient is not pregnant      Passed - Valid encounter within last 12 months    Recent Outpatient Visits           2 weeks ago Perimenopause   [place] [facility] Jackolyn Confer, MD

## 2023-06-18 LAB — COLOGUARD: COLOGUARD: NEGATIVE

## 2023-07-10 ENCOUNTER — Ambulatory Visit: Admitting: Pediatrics

## 2023-07-30 ENCOUNTER — Ambulatory Visit: Admitting: Pediatrics

## 2023-08-12 ENCOUNTER — Ambulatory Visit: Admitting: Pediatrics

## 2023-10-20 ENCOUNTER — Emergency Department

## 2023-10-20 ENCOUNTER — Encounter: Payer: Self-pay | Admitting: Internal Medicine

## 2023-10-20 ENCOUNTER — Observation Stay
Admission: EM | Admit: 2023-10-20 | Discharge: 2023-10-21 | Disposition: A | Discharge status: Home / Self Care | Attending: Internal Medicine | Admitting: Internal Medicine

## 2023-10-20 DIAGNOSIS — E876 Hypokalemia: Secondary | ICD-10-CM | POA: Diagnosis not present

## 2023-10-20 DIAGNOSIS — E785 Hyperlipidemia, unspecified: Secondary | ICD-10-CM | POA: Diagnosis not present

## 2023-10-20 DIAGNOSIS — I10 Essential (primary) hypertension: Secondary | ICD-10-CM | POA: Diagnosis not present

## 2023-10-20 DIAGNOSIS — K2100 Gastro-esophageal reflux disease with esophagitis, without bleeding: Secondary | ICD-10-CM

## 2023-10-20 DIAGNOSIS — F32A Depression, unspecified: Secondary | ICD-10-CM | POA: Diagnosis not present

## 2023-10-20 DIAGNOSIS — K219 Gastro-esophageal reflux disease without esophagitis: Secondary | ICD-10-CM | POA: Diagnosis not present

## 2023-10-20 DIAGNOSIS — R079 Chest pain, unspecified: Secondary | ICD-10-CM | POA: Diagnosis present

## 2023-10-20 DIAGNOSIS — F419 Anxiety disorder, unspecified: Secondary | ICD-10-CM | POA: Diagnosis not present

## 2023-10-20 DIAGNOSIS — F418 Other specified anxiety disorders: Secondary | ICD-10-CM | POA: Diagnosis present

## 2023-10-20 DIAGNOSIS — I160 Hypertensive urgency: Secondary | ICD-10-CM | POA: Diagnosis present

## 2023-10-20 LAB — CBC WITH DIFFERENTIAL/PLATELET
Abs Immature Granulocytes: 0.02 K/uL (ref 0.00–0.07)
Basophils Absolute: 0.1 K/uL (ref 0.0–0.1)
Basophils Relative: 1 %
Eosinophils Absolute: 0.2 K/uL (ref 0.0–0.5)
Eosinophils Relative: 2 %
HCT: 41 % (ref 36.0–46.0)
Hemoglobin: 13.5 g/dL (ref 12.0–15.0)
Immature Granulocytes: 0 %
Lymphocytes Relative: 25 %
Lymphs Abs: 1.8 K/uL (ref 0.7–4.0)
MCH: 28.6 pg (ref 26.0–34.0)
MCHC: 32.9 g/dL (ref 30.0–36.0)
MCV: 86.9 fL (ref 80.0–100.0)
Monocytes Absolute: 0.6 K/uL (ref 0.1–1.0)
Monocytes Relative: 8 %
Neutro Abs: 4.8 K/uL (ref 1.7–7.7)
Neutrophils Relative %: 64 %
Platelets: 287 K/uL (ref 150–400)
RBC: 4.72 MIL/uL (ref 3.87–5.11)
RDW: 13.3 % (ref 11.5–15.5)
WBC: 7.4 K/uL (ref 4.0–10.5)
nRBC: 0 % (ref 0.0–0.2)

## 2023-10-20 LAB — COMPREHENSIVE METABOLIC PANEL WITH GFR
ALT: 30 U/L (ref 0–44)
AST: 33 U/L (ref 15–41)
Albumin: 3.7 g/dL (ref 3.5–5.0)
Alkaline Phosphatase: 60 U/L (ref 38–126)
Anion gap: 10 (ref 5–15)
BUN: 15 mg/dL (ref 6–20)
CO2: 24 mmol/L (ref 22–32)
Calcium: 10.3 mg/dL (ref 8.9–10.3)
Chloride: 106 mmol/L (ref 98–111)
Creatinine, Ser: 0.7 mg/dL (ref 0.44–1.00)
GFR, Estimated: >60 mL/min (ref >60)
Glucose, Bld: 112 mg/dL — ABNORMAL HIGH (ref 70–99)
Potassium: 3.3 mmol/L — ABNORMAL LOW (ref 3.5–5.1)
Sodium: 140 mmol/L (ref 135–145)
Total Bilirubin: 0.5 mg/dL (ref 0.0–1.2)
Total Protein: 6.7 g/dL (ref 6.5–8.1)

## 2023-10-20 LAB — TROPONIN I (HIGH SENSITIVITY): Troponin I (High Sensitivity): 3 ng/L (ref <18)

## 2023-10-20 LAB — HCG, QUANTITATIVE, PREGNANCY: hCG, Beta Chain, Quant, S: 1 m[IU]/mL (ref <5)

## 2023-10-20 LAB — D-DIMER, QUANTITATIVE: D-Dimer, Quant: 0.32 ug{FEU}/mL (ref 0.00–0.50)

## 2023-10-20 MED ORDER — LORAZEPAM 0.5 MG PO TABS: 0.5000 mg | ORAL_TABLET | Freq: Three times a day (TID) | ORAL | Status: DC | PRN

## 2023-10-20 MED ORDER — ACETAMINOPHEN 325 MG PO TABS
650.0000 mg | ORAL_TABLET | Freq: Four times a day (QID) | ORAL | Status: DC | PRN
  Administered 2023-10-21 (×2): 650 mg via ORAL
  Filled 2023-10-20: qty 2

## 2023-10-20 MED ORDER — ONDANSETRON HCL 4 MG/2ML IJ SOLN: 4.0000 mg | Freq: Three times a day (TID) | INTRAMUSCULAR | Status: DC | PRN

## 2023-10-20 MED ORDER — OXYCODONE-ACETAMINOPHEN 5-325 MG PO TABS: 1.0000 | ORAL_TABLET | ORAL | Status: DC | PRN

## 2023-10-20 MED ORDER — HYDRALAZINE HCL 20 MG/ML IJ SOLN: 5.0000 mg | INTRAMUSCULAR | Status: DC | PRN

## 2023-10-20 MED ORDER — ASPIRIN 81 MG PO TBEC: 81.0000 mg | DELAYED_RELEASE_TABLET | Freq: Every day | ORAL | Status: DC

## 2023-10-20 MED ORDER — FENTANYL CITRATE PF 50 MCG/ML IJ SOSY: 25.0000 ug | PREFILLED_SYRINGE | INTRAMUSCULAR | Status: DC | PRN

## 2023-10-20 MED ORDER — PANTOPRAZOLE SODIUM 40 MG PO TBEC
40.0000 mg | DELAYED_RELEASE_TABLET | Freq: Every day | ORAL | Status: DC
  Administered 2023-10-21 (×2): 40 mg via ORAL
  Filled 2023-10-20: qty 1

## 2023-10-20 MED ORDER — NITROGLYCERIN 0.4 MG SL SUBL: 0.4000 mg | SUBLINGUAL_TABLET | SUBLINGUAL | Status: DC | PRN

## 2023-10-20 MED ORDER — HYDRALAZINE HCL 20 MG/ML IJ SOLN: 10.0000 mg | INTRAMUSCULAR | Status: DC | PRN

## 2023-10-20 MED ORDER — NITROGLYCERIN 0.4 MG SL SUBL
0.4000 mg | SUBLINGUAL_TABLET | Freq: Once | SUBLINGUAL | Status: DC
  Filled 2023-10-20: qty 1

## 2023-10-20 MED ORDER — POTASSIUM CHLORIDE CRYS ER 20 MEQ PO TBCR
40.0000 meq | EXTENDED_RELEASE_TABLET | Freq: Once | ORAL | Status: AC
  Administered 2023-10-21 (×2): 40 meq via ORAL
  Filled 2023-10-20: qty 2

## 2023-10-20 MED ORDER — ALUM & MAG HYDROXIDE-SIMETH 200-200-20 MG/5ML PO SUSP
30.0000 mL | Freq: Once | ORAL | Status: AC
  Administered 2023-10-20 (×2): 30 mL via ORAL
  Filled 2023-10-20: qty 30

## 2023-10-20 MED ORDER — LIDOCAINE VISCOUS HCL 2 % MT SOLN
15.0000 mL | Freq: Once | OROMUCOSAL | Status: AC
  Administered 2023-10-20 (×2): 15 mL via ORAL
  Filled 2023-10-20: qty 15

## 2023-10-20 NOTE — ED Triage Notes (Signed)
 Chest pain since yesterday that has been intermittent. Ongoing into today and on the way to work had to pull over due to CP, SHOB and tightness. No cardiac hX. States she is anxious at times and may contribute. Patient is mildly clammy on arrival. EMS gave 324 ASA and nitroglycerin  paste

## 2023-10-20 NOTE — ED Notes (Signed)
 Nitropaste from EMS removed.

## 2023-10-20 NOTE — H&P (Addendum)
 History and Physical    Cristina Aguilar FMW:[id number] DOB: 12-21-1970 DOA: 10/20/2023  Referring MD/NP/PA:   PCP: Herold Hadassah SQUIBB, MD   Patient coming from:  The patient is coming from home.     Chief Complaint: Chest pain  HPI: Cristina Aguilar is a 53 y.o. female with medical history significant of mild CAD, hypertension, hyperlipidemia, prediabetes, GERD, depression with anxiety, RLS, migraine, who presents with chest pain.  Patient states that her chest pain started yesterday, which is located in the left side of chest, pressure-like, 10 out of 10 at worst time, currently subsided, radiating to the left arm.  Not aggravated or alleviated by any known factors.  Patient states that she has had 3 episodes of chest pain and pressure.  It is associated with anxiety, diaphoresis.  No nausea, vomiting, diarrhea or abdominal pain.  No symptoms of UTI.  Patient has acid reflux and burping symptoms.  Patient is on 3rd day of her menstrual period  toady. Pt received 324 mg of aspirin  by EMS.  Per report, patient had elevated blood pressure up to 200s.  Her blood pressure was initially was 169/91 in the ED, then decreased to 118/68.  Data reviewed independently and ED Course: pt was found to have troponin 3, WBC 7.4, negative D-dimer 0.32, potassium 3.3, phosphorus of 2.2, GFR> 60, temperature normal, heart rate 69, RR 18, oxygen saturation 100% on room air.  Chest x-ray negative.  Patient is placed into a bed for observation.  Message sent to CV [facility] consult pool for [facility] card consult.   EKG: I have personally reviewed.  Sinus rhythm, QTc 413, early R wave progression, no ischemic change.  Review of Systems:   General: no fevers, chills, no body weight gain, has fatigue HEENT: no blurry vision, hearing changes or sore throat Respiratory: no dyspnea, coughing, wheezing CV: has chest pain/pressure, no palpitations GI: no nausea, vomiting, abdominal pain, diarrhea, constipation GU: no  dysuria, burning on urination, increased urinary frequency, hematuria  Ext: no leg edema Neuro: no unilateral weakness, numbness, or tingling, no vision change or hearing loss Skin: no rash, no skin tear. MSK: No muscle spasm, no deformity, no limitation of range of movement in spin Heme: No easy bruising.  Travel history: No recent long distant travel. Psychiatry: has anxiety   Allergy:  Allergies  Allergen Reactions   Codeine Shortness Of Breath   Morphine And Codeine Other (See Comments)    Seizures    Sulfa Antibiotics Rash    Past Medical History:  Diagnosis Date   Anxiety    Anxiety attack 07/19/2015   30 xanax q six months okay per pcp.     Family history of adverse reaction to anesthesia    Maternal aunt - slow to wake   GERD (gastroesophageal reflux disease)    Hyperlipidemia    Hypertension    Migraine headache    2-3 per year   Restless leg syndrome     Past Surgical History:  Procedure Laterality Date   HERNIA REPAIR     TONSILLECTOMY     TUBAL LIGATION      Social History:  reports that she has quit smoking. Her smoking use included cigarettes. She started smoking about 35 years ago. She has a 17.2 pack-year smoking history. She has never used smokeless tobacco. She reports that she does not drink alcohol and does not use drugs.  Family History:  Family History  Problem Relation Age of Onset   Heart  disease Mother    Cancer Father    Breast cancer Maternal Grandmother        mat great gm     Prior to Admission medications   Medication Sig Start Date End Date Taking? Authorizing Provider  atorvastatin  (LIPITOR) 10 MG tablet TAKE ONE TABLET BY MOUTH EVERY DAY 06/16/23   Herold Hadassah SQUIBB, MD  cetirizine  (ZYRTEC ) 10 MG tablet Take 1 tablet (10 mg total) by mouth daily. 05/27/23   Herold Hadassah SQUIBB, MD  PARoxetine  (PAXIL ) 10 MG tablet Take 1 tablet (10 mg total) by mouth daily. 05/27/23   Herold Hadassah SQUIBB, MD    Physical Exam: Vitals:   10/20/23 2130  10/20/23 2200 10/20/23 2230 10/20/23 2300  BP: 131/64 119/73 129/75 118/68  Pulse: 63 (!) 59 76 60  Resp: (!) 21 19 16 13   Temp:      TempSrc:      SpO2: 98% 100% 98% 100%   General: Not in acute distress HEENT:       Eyes: PERRL, EOMI, no jaundice       ENT: No discharge from the ears and nose, no pharynx injection, no tonsillar enlargement.        Neck: No JVD, no bruit, no mass felt. Heme: No neck lymph node enlargement. Cardiac: S1/S2, RRR, No murmurs, No gallops or rubs. Respiratory: No rales, wheezing, rhonchi or rubs. GI: Soft, nondistended, nontender, no rebound pain, no organomegaly, BS present. GU: No hematuria Ext: No pitting leg edema bilaterally. 1+DP/PT pulse bilaterally. Musculoskeletal: No joint deformities, No joint redness or warmth, no limitation of ROM in spin. Skin: No rashes.  Neuro: Alert, oriented X3, cranial nerves II-XII grossly intact, moves all extremities normally. Psych: Patient is not psychotic, no suicidal or hemocidal ideation.  Labs on Admission: I have personally reviewed following labs and imaging studies  CBC: Recent Labs  Lab 10/20/23 2102  WBC 7.4  NEUTROABS 4.8  HGB 13.5  HCT 41.0  MCV 86.9  PLT 287   Basic Metabolic Panel: Recent Labs  Lab 10/20/23 2100 10/20/23 2102  NA  --  140  K  --  3.3*  CL  --  106  CO2  --  24  GLUCOSE  --  112*  BUN  --  15  CREATININE  --  0.70  CALCIUM   --  10.3  MG 2.2  --   PHOS 2.2*  --    GFR: CrCl cannot be calculated (Unknown ideal weight.). Liver Function Tests: Recent Labs  Lab 10/20/23 2102  AST 33  ALT 30  ALKPHOS 60  BILITOT 0.5  PROT 6.7  ALBUMIN 3.7   No results for input(s): LIPASE, AMYLASE in the last 168 hours. No results for input(s): AMMONIA in the last 168 hours. Coagulation Profile: Recent Labs  Lab 10/20/23 2100  INR 1.0   Cardiac Enzymes: No results for input(s): CKTOTAL, CKMB, CKMBINDEX, TROPONINI in the last 168 hours. BNP (last 3  results) No results for input(s): PROBNP in the last 8760 hours. HbA1C: No results for input(s): HGBA1C in the last 72 hours. CBG: No results for input(s): GLUCAP in the last 168 hours. Lipid Profile: Recent Labs    10/20/23 2100  CHOL 189  HDL 40*  LDLCALC 82  TRIG 663*  CHOLHDL 4.7   Thyroid Function Tests: No results for input(s): TSH, T4TOTAL, FREET4, T3FREE, THYROIDAB in the last 72 hours. Anemia Panel: No results for input(s): VITAMINB12, FOLATE, FERRITIN, TIBC, IRON, RETICCTPCT in the last 72 hours.  Urine analysis: No results found for: COLORURINE, APPEARANCEUR, LABSPEC, PHURINE, GLUCOSEU, HGBUR, BILIRUBINUR, KETONESUR, PROTEINUR, UROBILINOGEN, NITRITE, LEUKOCYTESUR Sepsis Labs: @LABRCNTIP (procalcitonin:4,lacticidven:4) )No results found for this or any previous visit (from the past 240 hours).   Radiological Exams on Admission:   Assessment/Plan Principal Problem:   Chest pain Active Problems:   Hypertension   Hypertensive urgency   Hyperlipidemia   GERD (gastroesophageal reflux disease)   Hypokalemia   Hypophosphatemia   Depression with anxiety   Assessment and Plan:  Chest pain: Etiology is noncardiac, D-dimer negative, low suspicions for PE.  Patient seems to have episodic chest pain, associated with anxiety and diaphoresis.  Initially patient had elevated blood pressure with SBP up to 200, it has improved to 118/68 without giving blood pressure medication in ED.  Pheochromocytoma is a differential diagnosis.  Currently chest pain subsided.  - place to tele bed for observation - Trend Trop - check metanephrine level - prn fentanyl , Percocet, Tylenol  for pain - prn Nitroglycerin , Morphine - ASA - Statin: Lipitor - Risk factor stratification: will check FLP and A1C  - check UDS - Message is sent to [facility] consult pool for [facility] card consult.  Hypertension and hypertensive urgency: Current blood  pressure 118/68. - IV hydralazine  as needed  Hyperlipidemia -Lipitor  GERD (gastroesophageal reflux disease) -Protonix   Hypokalemia and hypophosphatemia: Potassium 3.3, phosphorus of 2.2.  Magnesium normal 2.2. -Repleted potassium and phosphorus  Depression with anxiety: Patient is not taking Paxil . - Started as needed Ativan        DVT ppx: SCD  Code Status: Full code     Family Communication:   Yes, patient's best friend   at bed side.       Disposition Plan:  Anticipate discharge back to previous environment  Consults called:  Message sent to CV [facility] consult pool for [facility] card consult.  Admission status and Level of care: Telemetry Cardiac:    for obs      Dispo: The patient is from: Home              Anticipated d/c is to: Home              Anticipated d/c date is: 1 day              Patient currently is not medically stable to d/c.    Severity of Illness:  The appropriate patient status for this patient is OBSERVATION. Observation status is judged to be reasonable and necessary in order to provide the required intensity of service to ensure the patient's safety. The patient's presenting symptoms, physical exam findings, and initial radiographic and laboratory data in the context of their medical condition is felt to place them at decreased risk for further clinical deterioration. Furthermore, it is anticipated that the patient will be medically stable for discharge from the hospital within 2 midnights of admission.        Date of Service 10/21/2023    Caleb Exon [facility]   If 7PM-7AM, please contact night-coverage [email or web address] 10/21/2023, 12:45 AM

## 2023-10-20 NOTE — ED Notes (Signed)
 CCMD contacted and pt placed on monitor at this time.

## 2023-10-20 NOTE — ED Notes (Signed)
 EKG given to Dr. Waymond.

## 2023-10-20 NOTE — ED Provider Notes (Signed)
 [facility] Provider Note    Event Date/Time   First MD Initiated Contact with Patient 10/20/23 2018     (approximate)   History   Chest Pain   HPI  Cristina Aguilar is a 53 y.o. female with history of anxiety, panic attacks, hypertension, hyperlipidemia, GERD, presenting with chest tightness on the left side.  Nonradiating.  States has been intermittent since yesterday, intensified today.  She denies any shortness of breath, no leg swelling, no recent travel or surgeries, no history of DVTs or hormone use.  States that she has had a prior tubal ligation and is on her period currently.  Last pregnancy was 30 years ago.  States no prior history of MI.  Had been seen by cardiology in the past and got worked up for chest pain.  States that she had a negative workup several years back.  States that she felt anxious with the chest pain, was found by EMS to be systolic 200, states that the pain and symptoms improved after the nitro but was unsure if this was due to her feeling safer with EMS or if the Nitro paste was working.  No history of CHF.  States that she is a former smoker, quit in 2009.  Denies family history of MI.  Per independent history from EMS, they found her with chest tightness and shortness of breath, no prior cardiac history.  Was quite anxious at the time.  Mildly clammy.  They gave her nitro place and 324 of aspirin .   On independent chart review, she had a CT coronary done in 2023 that showed minimally obstructive CAD.   Physical Exam   Triage Vital Signs: ED Triage Vitals [10/20/23 2020]  Encounter Vitals Group     BP (!) 169/91     Girls Systolic BP Percentile      Girls Diastolic BP Percentile      Boys Systolic BP Percentile      Boys Diastolic BP Percentile      Pulse Rate 67     Resp 18     Temp 98 F (36.7 C)     Temp Source Oral     SpO2 100 %     Weight      Height      Head Circumference      Peak Flow      Pain Score       Pain Loc      Pain Education      Exclude from Growth Chart     Most recent vital signs: Vitals:   10/20/23 2230 10/20/23 2300  BP: 129/75 118/68  Pulse: 76 60  Resp: 16 13  Temp:    SpO2: 98% 100%     General: Awake, no distress.  CV:  Good peripheral perfusion.  Resp:  Normal effort.  Clear, no thoracic cage tenderness Abd:  No distention.  Soft nontender Other:  No unilateral calf swelling or tenderness.  No lower extremity edema.   ED Results / Procedures / Treatments   Labs (all labs ordered are listed, but only abnormal results are displayed) Labs Reviewed  COMPREHENSIVE METABOLIC PANEL WITH GFR - Abnormal; Notable for the following components:      Result Value   Potassium 3.3 (*)    Glucose, Bld 112 (*)    All other components within normal limits  CBC WITH DIFFERENTIAL/PLATELET  HCG, QUANTITATIVE, PREGNANCY  D-DIMER, QUANTITATIVE  TROPONIN I (HIGH SENSITIVITY)  TROPONIN I (  HIGH SENSITIVITY)     EKG  EKG shows, sinus rhythm, rate of 67, normal QRS, normal QTc, no obvious ischemic ST elevation, T wave flattening in aVL, not significantly changed compared to prior   RADIOLOGY On my independent interpretation, chest x-ray without obvious consolidation   PROCEDURES:  Critical Care performed: No  Procedures   MEDICATIONS ORDERED IN ED: Medications  nitroGLYCERIN  (NITROSTAT ) SL tablet 0.4 mg (0.4 mg Sublingual Patient Refused/Not Given 10/20/23 2321)  hydrALAZINE  (APRESOLINE ) injection 10 mg (has no administration in time range)  alum & mag hydroxide-simeth (MAALOX/MYLANTA) 200-200-20 MG/5ML suspension 30 mL (30 mLs Oral Given 10/20/23 2321)    And  lidocaine  (XYLOCAINE ) 2 % viscous mouth solution 15 mL (15 mLs Oral Given 10/20/23 2321)     IMPRESSION / MDM / ASSESSMENT AND PLAN / ED COURSE  I reviewed the triage vital signs and the nursing notes.                              Differential diagnosis includes, but is not limited to, angina, ACS,  anxiety, musculoskeletal pain, strain, did consider PE but she has no unilateral Tenderness, Does Not Take Any Hormones, No History of Malignancies or DVTs, No Recent Travel or Surgeries, She Is Low Risk We Will Get a D-Dimer, Labs, EKG, Troponin, Chest X-Ray.  Will Remove the Nitropaste and Reassess.  Patient's presentation is most consistent with acute presentation with potential threat to life or bodily function.  Independent interpretation of labs and imaging below.  Patient states that the chest pain initially improved and then came back, states that when it becomes intense, she starts to feel clammy.  Also states that she has some indigestion that is separate from the chest pressure.  Will give her some Maalox and additional nitro but given her symptoms we will plan to bring her in for cardiology evaluation.  Did discuss with cardiology who states that they can see her tomorrow.  Consulted hospitalist who is agreeable with plan for admission and will evaluate the patient.  She is admitted.  The patient is on the cardiac monitor to evaluate for evidence of arrhythmia and/or significant heart rate changes.   Clinical Course as of 10/20/23 2326  Tue Oct 20, 2023  2137 Independent review of labs, no leukocytosis, troponin is not elevated, electrolytes are not severely deranged, LFTs are normal, D-dimer is not elevated. [TT]  2138 DG Chest 1 View No active disease.  [TT]    Clinical Course User Index [TT] Waymond Lorelle Cummins, MD     FINAL CLINICAL IMPRESSION(S) / ED DIAGNOSES   Final diagnoses:  Chest pain, unspecified type     Rx / DC Orders   ED Discharge Orders     None        Note:  This document was prepared using Dragon voice recognition software and may include unintentional dictation errors.    Waymond Lorelle Cummins, MD 10/20/23 [phone]

## 2023-10-21 ENCOUNTER — Ambulatory Visit: Admitting: Pediatrics

## 2023-10-21 ENCOUNTER — Encounter: Payer: Self-pay | Admitting: Pediatrics

## 2023-10-21 ENCOUNTER — Encounter: Payer: Self-pay | Admitting: Internal Medicine

## 2023-10-21 ENCOUNTER — Other Ambulatory Visit: Payer: Self-pay

## 2023-10-21 VITALS — BP 128/79 | HR 57 | Temp 97.7°F | Wt 145.4 lb

## 2023-10-21 DIAGNOSIS — E7800 Pure hypercholesterolemia, unspecified: Secondary | ICD-10-CM

## 2023-10-21 DIAGNOSIS — I10 Essential (primary) hypertension: Secondary | ICD-10-CM | POA: Diagnosis not present

## 2023-10-21 DIAGNOSIS — M5430 Sciatica, unspecified side: Secondary | ICD-10-CM | POA: Insufficient documentation

## 2023-10-21 DIAGNOSIS — E785 Hyperlipidemia, unspecified: Secondary | ICD-10-CM | POA: Diagnosis not present

## 2023-10-21 DIAGNOSIS — Z862 Personal history of diseases of the blood and blood-forming organs and certain disorders involving the immune mechanism: Secondary | ICD-10-CM | POA: Diagnosis not present

## 2023-10-21 DIAGNOSIS — F418 Other specified anxiety disorders: Secondary | ICD-10-CM

## 2023-10-21 DIAGNOSIS — R002 Palpitations: Secondary | ICD-10-CM | POA: Diagnosis not present

## 2023-10-21 DIAGNOSIS — I160 Hypertensive urgency: Secondary | ICD-10-CM | POA: Diagnosis present

## 2023-10-21 DIAGNOSIS — R079 Chest pain, unspecified: Secondary | ICD-10-CM | POA: Diagnosis not present

## 2023-10-21 LAB — LIPID PANEL
Cholesterol: 189 mg/dL (ref 0–200)
HDL: 40 mg/dL — ABNORMAL LOW (ref >40)
LDL Cholesterol: 82 mg/dL (ref 0–99)
Total CHOL/HDL Ratio: 4.7 ratio
Triglycerides: 336 mg/dL — ABNORMAL HIGH (ref <150)
VLDL: 67 mg/dL — ABNORMAL HIGH (ref 0–40)

## 2023-10-21 LAB — CBC
HCT: 37.7 % (ref 36.0–46.0)
Hemoglobin: 12.5 g/dL (ref 12.0–15.0)
MCH: 28.3 pg (ref 26.0–34.0)
MCHC: 33.2 g/dL (ref 30.0–36.0)
MCV: 85.3 fL (ref 80.0–100.0)
Platelets: 270 K/uL (ref 150–400)
RBC: 4.42 MIL/uL (ref 3.87–5.11)
RDW: 13.3 % (ref 11.5–15.5)
WBC: 7.4 K/uL (ref 4.0–10.5)
nRBC: 0 % (ref 0.0–0.2)

## 2023-10-21 LAB — HEMOGLOBIN A1C
Hgb A1c MFr Bld: 5.6 % (ref 4.8–5.6)
Mean Plasma Glucose: 114 mg/dL

## 2023-10-21 LAB — HIV ANTIBODY (ROUTINE TESTING W REFLEX): HIV Screen 4th Generation wRfx: NONREACTIVE

## 2023-10-21 LAB — BASIC METABOLIC PANEL WITH GFR
Anion gap: 10 (ref 5–15)
BUN: 12 mg/dL (ref 6–20)
CO2: 24 mmol/L (ref 22–32)
Calcium: 9.3 mg/dL (ref 8.9–10.3)
Chloride: 106 mmol/L (ref 98–111)
Creatinine, Ser: 0.68 mg/dL (ref 0.44–1.00)
GFR, Estimated: >60 mL/min (ref >60)
Glucose, Bld: 104 mg/dL — ABNORMAL HIGH (ref 70–99)
Potassium: 3.8 mmol/L (ref 3.5–5.1)
Sodium: 140 mmol/L (ref 135–145)

## 2023-10-21 LAB — APTT: aPTT: 30 s (ref 24–36)

## 2023-10-21 LAB — PROTIME-INR
INR: 1 (ref 0.8–1.2)
Prothrombin Time: 13.4 s (ref 11.4–15.2)

## 2023-10-21 LAB — TROPONIN I (HIGH SENSITIVITY)
Troponin I (High Sensitivity): 3 ng/L (ref <18)
Troponin I (High Sensitivity): 3 ng/L (ref <18)

## 2023-10-21 LAB — PHOSPHORUS: Phosphorus: 2.2 mg/dL — ABNORMAL LOW (ref 2.5–4.6)

## 2023-10-21 LAB — MAGNESIUM: Magnesium: 2.2 mg/dL (ref 1.7–2.4)

## 2023-10-21 MED ORDER — BUSPIRONE HCL 5 MG PO TABS: 5.0000 mg | ORAL_TABLET | Freq: Two times a day (BID) | ORAL | 2 refills | Status: DC

## 2023-10-21 MED ORDER — POTASSIUM PHOSPHATES 15 MMOLE/5ML IV SOLN
15.0000 mmol | Freq: Once | INTRAVENOUS | Status: AC
  Administered 2023-10-21 (×2): 15 mmol via INTRAVENOUS
  Filled 2023-10-21: qty 5

## 2023-10-21 MED ORDER — ATORVASTATIN CALCIUM 20 MG PO TABS: 10.0000 mg | ORAL_TABLET | Freq: Every day | ORAL | Status: DC

## 2023-10-21 MED ORDER — LORATADINE 10 MG PO TABS: 10.0000 mg | ORAL_TABLET | Freq: Every day | ORAL | Status: DC

## 2023-10-21 MED ORDER — DESVENLAFAXINE SUCCINATE ER 25 MG PO TB24: 25.0000 mg | ORAL_TABLET | Freq: Every day | ORAL | 2 refills | Status: DC

## 2023-10-21 NOTE — Discharge Summary (Signed)
 Physician Discharge Summary  Cristina Aguilar FMW:[id number] DOB: 11/15/1970 DOA: 10/20/2023  PCP: Herold Hadassah SQUIBB, MD  Admit date: 10/20/2023 Discharge date: 10/21/2023  Recommendations for Outpatient Follow-up:  -none since pt left on AMA   Brief/Interim Summary (HPI): Cristina Aguilar is a 53 y.o. female with medical history significant of mild CAD, hypertension, hyperlipidemia, prediabetes, GERD, depression with anxiety, RLS, migraine, who presents with chest pain.   Patient states that her chest pain started yesterday, which is located in the left side of chest, pressure-like, 10 out of 10 at worst time, currently subsided, radiating to the left arm.  Not aggravated or alleviated by any known factors.  Patient states that she has had 3 episodes of chest pain and pressure.  It is associated with anxiety, diaphoresis.  No nausea, vomiting, diarrhea or abdominal pain.  No symptoms of UTI.  Patient has acid reflux and burping symptoms.  Patient is on 3rd day of her menstrual period  toady. Pt received 324 mg of aspirin  by EMS.   Per report, patient had elevated blood pressure up to 200s.  Her blood pressure was initially was 169/91 in the ED, then decreased to 118/68.   Data reviewed independently and ED Course: pt was found to have troponin 3, WBC 7.4, negative D-dimer 0.32, potassium 3.3, phosphorus of 2.2, GFR> 60, temperature normal, heart rate 69, RR 18, oxygen saturation 100% on room air.  Chest x-ray negative.  Patient is placed into a bed for observation.  Message sent to CV [facility] consult pool for [facility] card consult.     EKG: I have personally reviewed.  Sinus rhythm, QTc 413, early R wave progression, no ischemic change.   Discharge Diagnoses and Hospital Course:   Principal Problem:   Chest pain Active Problems:   Hypertension   Hypertensive urgency   Hyperlipidemia   GERD (gastroesophageal reflux disease)   Hypokalemia   Hypophosphatemia   Depression with anxiety  Pt is  admitted d due to CP, complete A&P was made as below.  Unfortunately, per report pt left hospital on AMA when I was not in hospital.   Chest pain: Etiology is noncardiac, D-dimer negative, low suspicions for PE.  Patient seems to have episodic chest pain, associated with anxiety and diaphoresis.  Initially patient had elevated blood pressure with SBP up to 200, it has improved to 118/68 without giving blood pressure medication in ED.  Pheochromocytoma is a differential diagnosis.  Currently chest pain subsided.   - placed to tele bed for observation - Trending Trop - checked metanephrine level - prn fentanyl , Percocet, Tylenol  for pain - prn Nitroglycerin , Morphine - ASA - Statin: Lipitor - Risk factor stratification: will check FLP and A1C  - checked UDS - Message is sent to [facility] consult pool for [facility] card consult, but card did not have chance to see pt   Hypertension and hypertensive urgency: Current blood pressure 118/68. - IV hydralazine  as needed in hospital   Hyperlipidemia -Lipitor   GERD (gastroesophageal reflux disease) -Protonix    Hypokalemia and hypophosphatemia: Potassium 3.3, phosphorus of 2.2.  Magnesium normal 2.2. -Repleted potassium and phosphorus   Depression with anxiety: Patient is not taking Paxil . - Started as needed Ativan      Discharge Instructions: none   Allergies as of 10/21/2023       Reactions   Codeine Shortness Of Breath   Morphine And Codeine Other (See Comments)   Seizures   Sulfa Antibiotics Rash  Medication List     ASK your doctor about these medications    atorvastatin  10 MG tablet Commonly known as: LIPITOR TAKE ONE TABLET BY MOUTH EVERY DAY   cetirizine  10 MG tablet Commonly known as: ZYRTEC  Take 1 tablet (10 mg total) by mouth daily.   PARoxetine  10 MG tablet Commonly known as: PAXIL  Take 1 tablet (10 mg total) by mouth daily.        Allergies  Allergen Reactions   Codeine Shortness Of Breath    Morphine And Codeine Other (See Comments)    Seizures    Sulfa Antibiotics Rash      Procedures/Studies: DG Chest 1 View Result Date: 10/20/2023 CLINICAL DATA:  Chest pain EXAM: CHEST  1 VIEW COMPARISON:  Chest x-ray 08/01/2021 FINDINGS: The heart size and mediastinal contours are within normal limits. Both lungs are clear. The visualized skeletal structures are unremarkable. IMPRESSION: No active disease. Electronically Signed   By: Greig Pique M.D.   On: 10/20/2023 21:29      Discharge Exam: Vitals:   10/21/23 0500 10/21/23 0506  BP: 138/76   Pulse: 62   Resp: (!) 24   Temp:  98.5 F (36.9 C)  SpO2: 96%    Vitals:   10/21/23 0430 10/21/23 0445 10/21/23 0500 10/21/23 0506  BP: (!) 144/80  138/76   Pulse: 67 60 62   Resp: (!) 22 17 (!) 24   Temp:    98.5 F (36.9 C)  TempSrc:    Oral  SpO2: 99% 98% 96%   Weight:      Height:       PE is not performed since pt left on AMA when I was not in hospital     The results of significant diagnostics from this hospitalization (including imaging, microbiology, ancillary and laboratory) are listed below for reference.     Microbiology: No results found for this or any previous visit (from the past 240 hours).   Labs: BNP (last 3 results) No results for input(s): BNP in the last 8760 hours. Basic Metabolic Panel: Recent Labs  Lab 10/20/23 2100 10/20/23 2102 10/21/23 0203  NA  --  140 140  K  --  3.3* 3.8  CL  --  106 106  CO2  --  24 24  GLUCOSE  --  112* 104*  BUN  --  15 12  CREATININE  --  0.70 0.68  CALCIUM   --  10.3 9.3  MG 2.2  --   --   PHOS 2.2*  --   --    Liver Function Tests: Recent Labs  Lab 10/20/23 2102  AST 33  ALT 30  ALKPHOS 60  BILITOT 0.5  PROT 6.7  ALBUMIN 3.7   No results for input(s): LIPASE, AMYLASE in the last 168 hours. No results for input(s): AMMONIA in the last 168 hours. CBC: Recent Labs  Lab 10/20/23 2102 10/21/23 0203  WBC 7.4 7.4  NEUTROABS 4.8  --    HGB 13.5 12.5  HCT 41.0 37.7  MCV 86.9 85.3  PLT 287 270   Cardiac Enzymes: No results for input(s): CKTOTAL, CKMB, CKMBINDEX, TROPONINI in the last 168 hours. BNP: Invalid input(s): POCBNP CBG: No results for input(s): GLUCAP in the last 168 hours. D-Dimer Recent Labs    10/20/23 2038  DDIMER 0.32   Hgb A1c No results for input(s): HGBA1C in the last 72 hours. Lipid Profile Recent Labs    10/20/23 2100  CHOL 189  HDL 40*  LDLCALC 82  TRIG 336*  CHOLHDL 4.7   Thyroid function studies No results for input(s): TSH, T4TOTAL, T3FREE, THYROIDAB in the last 72 hours.  Invalid input(s): FREET3 Anemia work up No results for input(s): VITAMINB12, FOLATE, FERRITIN, TIBC, IRON, RETICCTPCT in the last 72 hours. Urinalysis No results found for: COLORURINE, APPEARANCEUR, LABSPEC, PHURINE, GLUCOSEU, HGBUR, BILIRUBINUR, KETONESUR, PROTEINUR, UROBILINOGEN, NITRITE, LEUKOCYTESUR Sepsis Labs Recent Labs  Lab 10/20/23 2102 10/21/23 0203  WBC 7.4 7.4   Microbiology No results found for this or any previous visit (from the past 240 hours).  Time coordinating discharge:  15 minutes.   SIGNED:  Caleb Exon, MD [facility] 10/21/2023, 12:13 PM   If 7PM-7AM, please contact night-coverage [email or web address]

## 2023-10-21 NOTE — ED Notes (Signed)
 Dr. Cleatus reports that she will not be providing pt with any d/c instructions. Pt IV removed. Pt signed AMA form.

## 2023-10-21 NOTE — Progress Notes (Unsigned)
   Office Visit  BP 128/79   Pulse (!) 57   Temp 97.7 F (36.5 C) (Oral)   Wt 145 lb 6.4 oz (66 kg)   SpO2 99%   BMI 26.59 kg/m    Subjective:    Patient ID: Cristina Aguilar [place], female    DOB: [birth year], 53 y.o.   MRN: [id number]  HPI: LEMMIE STEINHAUS is a 53 y.o. female  Chief Complaint  Patient presents with  . Anxiety    Drunk a energy drink ans she stated blood pressure shot up and went to the emergency room last night     Discussed the use of AI scribe software for clinical note transcription with the patient, who gave verbal consent to proceed.  History of Present Illness     Relevant past medical, surgical, family and social history reviewed and updated as indicated. Interim medical history since our last visit reviewed. Allergies and medications reviewed and updated.  ROS per HPI unless specifically indicated above     Objective:    BP 128/79   Pulse (!) 57   Temp 97.7 F (36.5 C) (Oral)   Wt 145 lb 6.4 oz (66 kg)   SpO2 99%   BMI 26.59 kg/m   Wt Readings from Last 3 Encounters:  10/21/23 145 lb 6.4 oz (66 kg)  10/21/23 162 lb 1.6 oz (73.5 kg)  05/27/23 151 lb 3.2 oz (68.6 kg)     Physical Exam      10/21/2023    3:02 PM 05/27/2023    4:27 PM 09/24/2022    1:58 PM  Depression screen PHQ 2/9  Decreased Interest 0 1 1  Down, Depressed, Hopeless 2 0 0  PHQ - 2 Score 2 1 1   Altered sleeping 2 2 2   Tired, decreased energy 0 3 2  Change in appetite 0 3 3  Feeling bad or failure about yourself  0 0 0  Trouble concentrating 1 1 1   Moving slowly or fidgety/restless 0 0 0  Suicidal thoughts 0 0 0  PHQ-9 Score 5 10 9   Difficult doing work/chores Not difficult at all  Somewhat difficult       10/21/2023    3:03 PM 05/27/2023    4:27 PM 09/24/2022    1:58 PM  GAD 7 : Generalized Anxiety Score  Nervous, Anxious, on Edge 2 1 1   Control/stop worrying 2 3 2   Worry too much - different things 2 1 2   Trouble relaxing 2 3 2   Restless 0 0 0  Easily  annoyed or irritable 0 1 1  Afraid - awful might happen 1 0 1  Total GAD 7 Score 9 9 9   Anxiety Difficulty Not difficult at all  Somewhat difficult       Assessment & Plan:  Assessment & Plan   There are no diagnoses linked to this encounter.   Assessment and Plan Assessment & Plan      Follow up plan: No follow-ups on file.  Hadassah SHAUNNA Nett, MD

## 2023-10-21 NOTE — ED Notes (Signed)
 Dr. Cleatus raker and made aware by this RN that pt is wanting to be discharged home.

## 2023-10-21 NOTE — Patient Instructions (Addendum)
 Will send holter to your home to check on the palpitations  Start pristiq  25mg  and buspar  5mg  as needed for anxiety

## 2023-10-22 LAB — CBC WITH DIFFERENTIAL/PLATELET
Basophils Absolute: 0.1 x10E3/uL (ref 0.0–0.2)
Basos: 1 %
EOS (ABSOLUTE): 0.1 x10E3/uL (ref 0.0–0.4)
Eos: 2 %
Hematocrit: 46 % (ref 34.0–46.6)
Hemoglobin: 14.7 g/dL (ref 11.1–15.9)
Immature Grans (Abs): 0 x10E3/uL (ref 0.0–0.1)
Immature Granulocytes: 0 %
Lymphocytes Absolute: 1.7 x10E3/uL (ref 0.7–3.1)
Lymphs: 24 %
MCH: 27.5 pg (ref 26.6–33.0)
MCHC: 32 g/dL (ref 31.5–35.7)
MCV: 86 fL (ref 79–97)
Monocytes Absolute: 0.5 x10E3/uL (ref 0.1–0.9)
Monocytes: 8 %
Neutrophils Absolute: 4.4 x10E3/uL (ref 1.4–7.0)
Neutrophils: 65 %
Platelets: 301 x10E3/uL (ref 150–450)
RBC: 5.34 x10E6/uL — ABNORMAL HIGH (ref 3.77–5.28)
RDW: 13.3 % (ref 11.7–15.4)
WBC: 6.9 x10E3/uL (ref 3.4–10.8)

## 2023-10-22 LAB — VITAMIN B12: Vitamin B-12: 512 pg/mL (ref 232–1245)

## 2023-10-22 LAB — TSH: TSH: 1.84 u[IU]/mL (ref 0.450–4.500)

## 2023-10-22 LAB — LIPID PANEL
Chol/HDL Ratio: 4.1 ratio (ref 0.0–4.4)
Cholesterol, Total: 195 mg/dL (ref 100–199)
HDL: 48 mg/dL (ref >39)
LDL Chol Calc (NIH): 118 mg/dL — ABNORMAL HIGH (ref 0–99)
Triglycerides: 163 mg/dL — ABNORMAL HIGH (ref 0–149)
VLDL Cholesterol Cal: 29 mg/dL (ref 5–40)

## 2023-10-22 LAB — MAGNESIUM: Magnesium: 2.3 mg/dL (ref 1.6–2.3)

## 2023-10-23 ENCOUNTER — Ambulatory Visit: Payer: Self-pay | Admitting: Pediatrics

## 2023-10-25 LAB — METANEPHRINES, PLASMA
Metanephrine, Free: 61.3 pg/mL (ref 0.0–88.0)
Normetanephrine, Free: 62.1 pg/mL (ref 0.0–244.0)

## 2023-10-27 ENCOUNTER — Encounter: Payer: Self-pay | Admitting: Pediatrics

## 2023-10-27 NOTE — Assessment & Plan Note (Signed)
Due for repeat blood work

## 2023-10-27 NOTE — Assessment & Plan Note (Signed)
 Anxiety exacerbated by stressors, presenting with chest pressure, sleep disturbance, and hot flashes. Previous medications not tolerated due to sedation. - Start Pristiq  to target norepinephrine, alleviate anxiety and hot flashes. - Prescribe Buspar  as needed for anxiety until Pristiq  takes effect. - Perform genetic swab test for medication sensitivity.

## 2023-10-28 ENCOUNTER — Ambulatory Visit: Attending: Pediatrics

## 2023-10-28 DIAGNOSIS — R002 Palpitations: Secondary | ICD-10-CM

## 2023-11-18 ENCOUNTER — Other Ambulatory Visit (HOSPITAL_COMMUNITY)
Admission: RE | Admit: 2023-11-18 | Discharge: 2023-11-18 | Disposition: A | Discharge status: Home / Self Care | Source: Ambulatory Visit | Attending: Pediatrics | Admitting: Pediatrics

## 2023-11-18 ENCOUNTER — Ambulatory Visit: Admitting: Pediatrics

## 2023-11-18 VITALS — BP 127/81 | HR 62 | Temp 97.4°F | Wt 148.4 lb

## 2023-11-18 DIAGNOSIS — I10 Essential (primary) hypertension: Secondary | ICD-10-CM | POA: Diagnosis not present

## 2023-11-18 DIAGNOSIS — R7303 Prediabetes: Secondary | ICD-10-CM

## 2023-11-18 DIAGNOSIS — Z6827 Body mass index (BMI) 27.0-27.9, adult: Secondary | ICD-10-CM | POA: Diagnosis not present

## 2023-11-18 DIAGNOSIS — I83811 Varicose veins of right lower extremities with pain: Secondary | ICD-10-CM | POA: Diagnosis not present

## 2023-11-18 DIAGNOSIS — R2242 Localized swelling, mass and lump, left lower limb: Secondary | ICD-10-CM | POA: Diagnosis not present

## 2023-11-18 DIAGNOSIS — Z1231 Encounter for screening mammogram for malignant neoplasm of breast: Secondary | ICD-10-CM

## 2023-11-18 DIAGNOSIS — Z124 Encounter for screening for malignant neoplasm of cervix: Secondary | ICD-10-CM | POA: Insufficient documentation

## 2023-11-18 NOTE — Patient Instructions (Addendum)
 Weight medications: Injectable medications: GLP1 agonist like ozempic, wegovy, zepbound, mounjaro  Once weekly injections Most common side effects: nausea, diarrhea, abdominal pain, constipation, acid reflux Higher risk for pancreatitis (inflammation of pancreas) - if developed this while on the medication would stop it Contraindications: history of medullary thyroid cancer, MEN2 disorders, pregnancy Depending on which injection, can see between 14-26% body weight loss  Oral medications - can do single pill or combined as below Welbutrin/naltrexone SE: headaches, insomnia, increased anxiety 8-10% weight loss Phentermine/topiramate SE: headaches, insomnia, increased anxiety 8-10% weight loss With topiramate: nausea is most common Orlistat SE: greasy stools, abdominal pain 2% weight loss   You have an order for:  []   2D Mammogram  [x]   3D Mammogram  []   Bone Density     Please call for appointment:  [facility]  [address] [place] [address] [phone] [facility] [address] [place], [place] [address] [phone] [facility] [address] [place], [place] [address] [phone]   Make sure to wear two-piece clothing.  No lotions, powders, or deodorants the day of the appointment. Make sure to bring picture ID and insurance card.  Bring list of medications you are currently taking including any supplements.   Schedule your Dane screening mammogram through MyChart!   Log into your MyChart account.  Go to 'Visit' (or 'Appointments' if on mobile App) --> Schedule an Appointment  Under 'Select a Reason for Visit' choose the Mammogram Screening option.  Complete the pre-visit questions and select the time and place that best fits your schedule.  Wegovy dosing schedule in case approved:    Go to following website for details  about administration, dosing, possible side effects, and risks (these are same as what I told you at our visit but just a reminder if you need it):  [email or web address]  [email or web address]  If you have increased nausea, you can remain at the highest dose you can tolerate until you have your follow up visit.  If you develop severe nausea, vomiting or abdominal pain, you should stop the medication and call our office.  As we discussed, the most common side effects of this medication are nausea and acid reflux. This can be reduced with slow titration of the medication, eating smaller meals to gage how you will respond tot he medication, and not eating right before laying down.   Other common complaints are constipation which can be reduced by making sure you are getting enough fiber and water. Adding an over the counter fiber supplement may help as well. Hydration is important. Because you get fuller easier, making sure you get enough water may prevent dehydration. This is especially important if you have any chronic kidney disease.   Please remember, that like all weight loss medications, this medication is contraindicated in pregnancy. Please reach out to discuss prior to getting pregnant and continue to use birth control methods.   With higher doses of Wegovy, people report more nausea on the day of and day after the injection.   This is thought to be due to the effects it has on the upper GI tract and stomach and stretching of the stomach too much or too fast.   Tips:   -- eat smaller meals (3 per day) --add a small snack between meals if needed  --eat slower --don't gulp large amounts of liquid at one time --  sips over the course of the day to make sure you are not getting dehydrated.    Some people do need antinausea medication.   Sometimes, people need to go down a step to a lower dose (you have a  refill on the other steps) or stop the medication and restart the steps.   Please let me know if you are having worsening nausea and we can discuss options.    If you have further questions about this medication or need any assistance with dosing/administration please call our office so we may help you. If needed, we can schedule an office visit to address your needs that cannot be addressed by phone.

## 2023-11-18 NOTE — Progress Notes (Signed)
 Office Visit  BP 127/81   Pulse 62   Temp (!) 97.4 F (36.3 C) (Oral)   Wt 148 lb 6.4 oz (67.3 kg)   LMP 11/15/2023 (Approximate)   SpO2 98%   BMI 27.14 kg/m    Subjective:    Patient ID: Cristina Aguilar, female    DOB: December 24, 1970, 53 y.o.   MRN: [id number]  HPI: Cristina Aguilar is a 53 y.o. female  Chief Complaint  Patient presents with   Leg Swelling    Discussed the use of AI scribe software for clinical note transcription with the patient, who gave verbal consent to proceed.  History of Present Illness   Cristina Aguilar is a 53 year old female who presents with leg swelling and pain.  She experiences pain in her left leg, which she attributes to walking on concrete floors. The pain initially appeared after extensive walking, leading her to wear compression socks daily for several days, removing them only for showers. After wearing the socks, a vein in her leg became swollen and painful to touch, prompting her to remove the socks. The following day, the swelling subsided, but she noticed fluid accumulation around her knee, which was visible but not painful. She has a history of varicose veins and has not previously consulted a vein specialist.  In 2021, she underwent an ultrasound of the leg to rule out clots. She mentions that the leg itself did not swell, only the vein, and the pain was localized to a specific spot on the leg. She occasionally goes to the gym but not regularly, and she speculates whether she might have injured the leg during exercise.  She has not picked up her last two prescribed medications, as she feels mentally well and without anxiety, suspecting a previous energy drink might have caused her symptoms. She underwent a genetic swab test.  Her B12 levels were previously checked and found to be normal. She is also interested in exploring prescription appetite suppressants for weight management, having previously experienced side effects with Wellbutrin,  which made her indifferent to time of day. She is familiar with using EpiPen-style injectables due to her daughter's allergies.  No pain or cramping in the back of the leg or calf when flexing her foot. No swelling in the leg itself, only in the vein.     Relevant past medical, surgical, family and social history reviewed and updated as indicated. Interim medical history since our last visit reviewed. Allergies and medications reviewed and updated.  ROS per HPI unless specifically indicated above     Objective:    BP 127/81   Pulse 62   Temp (!) 97.4 F (36.3 C) (Oral)   Wt 148 lb 6.4 oz (67.3 kg)   LMP 11/15/2023 (Approximate)   SpO2 98%   BMI 27.14 kg/m   Wt Readings from Last 3 Encounters:  11/18/23 148 lb 6.4 oz (67.3 kg)  10/21/23 145 lb 6.4 oz (66 kg)  10/21/23 162 lb 1.6 oz (73.5 kg)     Physical Exam Constitutional:      Appearance: Normal appearance.  Pulmonary:     Effort: Pulmonary effort is normal.  Musculoskeletal:        General: Normal range of motion.     Right lower leg: No swelling. No edema.     Left lower leg: No swelling. No edema.     Comments: Varicose veins, not erythematous or ulcerated  Skin:    Comments: Normal skin color  Neurological:     General: No focal deficit present.     Mental Status: She is alert. Mental status is at baseline.  Psychiatric:        Mood and Affect: Mood normal.        Behavior: Behavior normal.        Thought Content: Thought content normal.         11/26/2023   10:54 AM 11/18/2023    1:47 PM 10/21/2023    3:02 PM 05/27/2023    4:27 PM 09/24/2022    1:58 PM  Depression screen PHQ 2/9  Decreased Interest 0 0 0 1 1  Down, Depressed, Hopeless 0 0 2 0 0  PHQ - 2 Score 0 0 2 1 1   Altered sleeping  2 2 2 2   Tired, decreased energy  3 0 3 2  Change in appetite  2 0 3 3  Feeling bad or failure about yourself   0 0 0 0  Trouble concentrating  1 1 1 1   Moving slowly or fidgety/restless  0 0 0 0  Suicidal thoughts   0 0 0 0  PHQ-9 Score  8 5 10 9   Difficult doing work/chores  Not difficult at all Not difficult at all  Somewhat difficult       11/26/2023   10:54 AM 11/18/2023    1:48 PM 10/21/2023    3:03 PM 05/27/2023    4:27 PM  GAD 7 : Generalized Anxiety Score  Nervous, Anxious, on Edge 1 2 2 1   Control/stop worrying 1 2 2 3   Worry too much - different things 1 2 2 1   Trouble relaxing 1 3 2 3   Restless 0 0 0 0  Easily annoyed or irritable 0 0 0 1  Afraid - awful might happen 0 1 1 0  Total GAD 7 Score 4 10 9 9   Anxiety Difficulty  Not difficult at all Not difficult at all        Assessment & Plan:  Assessment & Plan   Localized swelling of left lower extremity Intermittent pain and swelling with no current symptoms. Previous normal ultrasound. Differential includes DVT. Compression socks may have contributed. Ultrasound recommended due to varicose veins and previous pain. - Order ultrasound of left lower extremity to rule out DVT. - Advise her to report any recurrence of swelling or pain. -     US  Venous Img Lower Unilateral Left (DVT); Future  Varicose veins of right lower extremity with pain Presence of varicose veins with increased visibility and occasional swelling. No previous specialist consultation. - Refer to vascular surgeon or vein specialist for evaluation and management of varicose veins. -     Ambulatory referral to Vascular Surgery  BMI 27.0-27.9,adult Prediabetes Primary hypertension Assessment & Plan: Discussed weight management options including GLP-1 agonists and oral medications. GLP-1 agonists offer significant weight loss but have GI side effects. Oral options like phentermine are effective short-term with stimulant-like side effects. Insurance coverage will determine availability. Meets GLP1 criteria by BMI +co morbidities: HTN, prediabetes. [employer] insurance request for GLP-1 agonist coverage. - Consider phentermine if GLP-1 agonists are not covered. - Provide  her with information on weight management options.  Orders: -     Wegovy ; Inject 0.25 mg into the skin once a week. (Patient not taking: Reported on 11/26/2023)  Dispense: 2 mL; Refill: 0   Screening for cervical cancer -     Cytology - PAP  Encounter for screening mammogram for malignant  neoplasm of breast -     3D Screening Mammogram, Left and Right; Future    Vitamin B12 supplementation (considering) B12 levels normal. Interested in supplementation if cost is reasonable. Alternatives include oral supplements or med spa injections. - Determine cost of B12 injections. - Discuss alternative options for B12 supplementation.  Clinical coverage for weight loss GLP's   Medication being dispensed is Wegovy  3 mL/28 day. Titration doses are 2 mL/28 days.   [x]  Product being prescribed is FDA approved for the indication, age, weight (if applicable) and not does not exceed dosing limits per the Prescribing Information per the clinical conditions for use.  [x]  Patient's baseline weight measured within the last 45 days as required by provider before dispensing.  [x]  Patient is new to therapy and One of the following:   [x]  The beneficiary is 52 years of age or over and has ONE of the following:  []  A BMI greater than or equal to 30 kg/m2  [x]  A BMI greater than or equal to 27 kg/m2 with at least one weight-related comorbidity/risk factor/complication (i.e. hypertension, type 2 diabetes, obstructive sleep apnea, cardiovascular disease, dyslipidemia)  If patient has one weight-related comorbidity/risk factor/complication (i.e. hypertension, type 2 diabetes, obstructive sleep apnea, cardiovascular disease, dyslipidemia), please list: HTN    [x]  The beneficiary has participated in 6 months or greater of lifestyle modification and will continue while on medication including structured nutrition following provider recommend dietary modifications and physical activity, unless physical activity is  not clinically appropriate at the time GLP1 therapy commences AND  [x]  The beneficiary will NOT be using the requested agent in combination with another GLP-1 receptor agonist agent AND  [x]  The beneficiary does NOT have any FDA-labeled contraindications to the requested agent, including pregnancy, lactation, history of medullary thyroid cancer or multiple endocrine neoplasia type II.   Last BMI/Weight/Height recorded Estimated body mass index is 27.14 kg/m as calculated from the following:   Height as of 10/21/23: 5' 2 (1.575 m).   Weight as of this encounter: 148 lb 6.4 oz (67.3 kg).   Follow up plan: Return in about 3 months (around 02/17/2024) for wt management.  Hadassah SHAUNNA Nett, MD

## 2023-11-23 LAB — CYTOLOGY - PAP
Comment: NEGATIVE
High risk HPV: NEGATIVE

## 2023-11-24 ENCOUNTER — Ambulatory Visit (INDEPENDENT_AMBULATORY_CARE_PROVIDER_SITE_OTHER): Payer: Self-pay

## 2023-11-25 ENCOUNTER — Ambulatory Visit: Payer: Self-pay

## 2023-11-25 DIAGNOSIS — Z6827 Body mass index (BMI) 27.0-27.9, adult: Secondary | ICD-10-CM | POA: Insufficient documentation

## 2023-11-25 MED ORDER — WEGOVY 0.25 MG/0.5ML ~~LOC~~ SOAJ: 0.2500 mg | SUBCUTANEOUS | 0 refills | Status: DC

## 2023-11-25 NOTE — Telephone Encounter (Signed)
 FYI Only or Action Required?: FYI only for provider.  Patient was last seen in primary care on 11/18/2023 by Herold Hadassah SQUIBB, MD.  Called Nurse Triage reporting Rash.  Symptoms began July.  Interventions attempted: Nothing.  Symptoms are: unchanged.  Triage Disposition: See PCP When Office is Open (Within 3 Days)  Patient/caregiver understands and will follow disposition?: Yes Reason for Disposition  [1] Scabies suspected (e.g., very itchy, bumpy rash; exposure to someone with scabies) AND [2] diagnosis has not been confirmed by a doctor (or NP/PA)  Answer Assessment - Initial Assessment Questions Santina to [facility] back July and was treated for dermatitis, as well as her son. Issue has been persistent, son was diagnosed with scabies today. Patient states she has no visible spots right now, but they come and go  1. PLACE OF EXPOSURE: Where were you when you were exposed? (e.g., home, work)     Around a dog with mange in July, developed a rash and was treated at [facility] for dermatitis.  2. TYPE OF EXPOSURE: How were you exposed? How much contact was there?     Son diagnosed with scabies at his PCP  4. SYMPTOMS: Do you have any symptoms? (e.g., itching, bumpy rash)  If Yes, ask: What does it look like? Which parts of your body are affected?     Not at the moment, but when it flares comes up between fingers, little line of bumps that's real itchy.  5. CLOSE CONTACTS: Does any person who lives with you or has had close contact with you have itching?     Son  Protocols used: Scabies Exposure-A-AH  Copied from CRM [id number]. Topic: Appointments - Appointment Scheduling >> Nov 25, 2023  4:15 PM Tonda B wrote: Patient/patient representative is calling to schedule an appointment. Refer to attachments for appointment information.  Patient has possible bug bites has rash as well as swelling

## 2023-11-26 ENCOUNTER — Encounter: Payer: Self-pay | Admitting: Nurse Practitioner

## 2023-11-26 ENCOUNTER — Telehealth (INDEPENDENT_AMBULATORY_CARE_PROVIDER_SITE_OTHER): Admitting: Nurse Practitioner

## 2023-11-26 DIAGNOSIS — B86 Scabies: Secondary | ICD-10-CM

## 2023-11-26 DIAGNOSIS — R002 Palpitations: Secondary | ICD-10-CM

## 2023-11-26 MED ORDER — PERMETHRIN 5 % EX CREA: TOPICAL_CREAM | CUTANEOUS | 2 refills | Status: DC

## 2023-11-26 NOTE — Assessment & Plan Note (Signed)
 Exposure to stray cat, both son and her have had bites.  Will send in Permethrin  cream to treat, plus send refills if ongoing bites noted in 14 days.  Recommend she clean all furniture and provided her information on how to clean living area.

## 2023-11-26 NOTE — Progress Notes (Signed)
 LMP 11/15/2023 (Approximate)    Subjective:    Patient ID: Cristina Aguilar, female    DOB: April 24, 1970, 53 y.o.   MRN: [id number]  HPI: Cristina Aguilar is a 53 y.o. female  Chief Complaint  Patient presents with   Scabies Exposure    Passing between her and her son on the nights he sleeps in bed with her.    Virtual Visit via Video Note  I connected with Cristina Aguilar on 11/26/23 at 10:40 AM EDT by a video enabled telemedicine application and verified that I am speaking with the correct person using two identifiers.  Location: Patient: home Provider: work   I discussed the limitations of evaluation and management by telemedicine and the availability of in person appointments. The patient expressed understanding and agreed to proceed.  I discussed the assessment and treatment plan with the patient. The patient was provided an opportunity to ask questions and all were answered. The patient agreed with the plan and demonstrated an understanding of the instructions.   The patient was advised to call back or seek an in-person evaluation if the symptoms worsen or if the condition fails to improve as anticipated.  I provided 25 minutes of non-face-to-face time during this encounter.   Briannia Laba T Vester Balthazor, NP   RASH Currently her son is getting treated for scabies, has been treating self at home for chiggers which they thought it was. She has noticed occasional bites on her. Got from stray cat that was on her property. Duration:  months  Location: face, hands, and arms  Itching: yes Burning: no Redness: yes Oozing: no Scaling: no Blisters: no Painful: no - itchy Fevers: no Change in detergents/soaps/personal care products: no Recent illness: no Recent travel:no History of same: yes Context: fluctuating Alleviating factors: hydrocortisone cream Treatments attempted:hydrocortisone cream Shortness of breath: no  Throat/tongue swelling: no Myalgias/arthralgias: no    Relevant past medical, surgical, family and social history reviewed and updated as indicated. Interim medical history since our last visit reviewed. Allergies and medications reviewed and updated.  Review of Systems  Constitutional:  Negative for activity change, appetite change, diaphoresis, fatigue and fever.  Respiratory:  Negative for cough, chest tightness and shortness of breath.   Cardiovascular:  Negative for chest pain, palpitations and leg swelling.  Gastrointestinal: Negative.   Skin:  Positive for rash.  Neurological: Negative.   Psychiatric/Behavioral: Negative.      Per HPI unless specifically indicated above     Objective:    LMP 11/15/2023 (Approximate)   Wt Readings from Last 3 Encounters:  11/18/23 148 lb 6.4 oz (67.3 kg)  10/21/23 145 lb 6.4 oz (66 kg)  10/21/23 162 lb 1.6 oz (73.5 kg)    Physical Exam Vitals and nursing note reviewed.  Constitutional:      General: She is awake. She is not in acute distress.    Appearance: She is well-developed. She is not ill-appearing.  HENT:     Head: Normocephalic.     Right Ear: Hearing normal.     Left Ear: Hearing normal.  Eyes:     General: Lids are normal.        Right eye: No discharge.        Left eye: No discharge.     Conjunctiva/sclera: Conjunctivae normal.  Pulmonary:     Effort: Pulmonary effort is normal. No accessory muscle usage or respiratory distress.  Musculoskeletal:     Cervical back: Normal range of motion.  Neurological:  Mental Status: She is alert and oriented to person, place, and time.  Psychiatric:        Attention and Perception: Attention normal.        Mood and Affect: Mood normal.        Behavior: Behavior normal. Behavior is cooperative.        Thought Content: Thought content normal.        Judgment: Judgment normal.    Results for orders placed or performed in visit on 11/18/23  Cytology - PAP   Collection Time: 11/18/23  2:07 PM  Result Value Ref Range   High  risk HPV Negative    Adequacy      Satisfactory for evaluation; transformation zone component PRESENT.   Diagnosis - Low grade squamous intraepithelial lesion (LSIL) (A)    Comment Normal Reference Range HPV - Negative       Assessment & Plan:   Problem List Items Addressed This Visit       Musculoskeletal and Integument   Scabies - Primary   Exposure to stray cat, both son and her have had bites.  Will send in Permethrin  cream to treat, plus send refills if ongoing bites noted in 14 days.  Recommend she clean all furniture and provided her information on how to clean living area.        Follow up plan: Return if symptoms worsen or fail to improve.

## 2023-11-26 NOTE — Patient Instructions (Signed)
Scabies, Adult  Scabies is a skin condition that happens when very small insects called mites get under your skin. This causes severe itchiness and a rash that looks like pimples. Scabies is contagious. This means it can spread easily from person to person. If you get scabies, the people you live with may get it too. With the right treatment, symptoms often go away in 2-4 weeks. In most cases, scabies does not cause lasting problems. What are the causes? Scabies is caused by tiny mites (Sarcoptes scabiei) that can only be seen with a microscope. The mites get into the top layer of your skin and lay eggs. This is called an infestation. You may get scabies if: You have close contact with someone who has scabies. You come in contact with items that have the mites on them. These may include towels, bedding, or clothes. What increases the risk? You may be more likely to get scabies if: You live in a nursing home or extended care facility. You spend time in a place where a lot of people live close together, such as a shelter or prison. You have sex with a partner who has scabies. You care for others who are at risk for scabies. What are the signs or symptoms? Symptoms of scabies include: A rash that looks like pimples. It may include tiny red bumps or blisters. It is often found in the skinfolds or on the hands, wrists, elbows, armpits, chest, waist, groin, or buttocks. Severe itchiness. This is often worse at night. Skin irritation. This can include scaly patches or sores. The bumps from scabies may form a line (burrow) on the skin. The line may look thin, crooked, and grayish-white or skin colored. How is this diagnosed? Scabies may be diagnosed based on a physical exam of your skin. You may also have a skin test done. A sample of your skin may be taken (skin scraping) and looked at under a microscope for signs of mites. How is this treated? Scabies may be treated with: Medicated creams or  lotions to kill the mites. The cream or lotion is spread on your whole body and left for a few hours. In most cases, one treatment is enough to kill all the mites. In severe cases, the treatment may need to be done more than once. Medicated cream to help with the itching. Medicines taken by mouth (orally). These may help: Relieve itching. Reduce the swelling and redness. Kill the mites. This treatment may be used in severe cases. Follow these instructions at home: Medicines Take or apply over-the-counter and prescription medicines only as told by your health care provider. Apply medicated cream or lotion as told by your provider. Do not wash off the medicated cream or lotion until enough time has passed or as told by your provider. Skin care Try not to scratch or pick at the affected areas of your skin. Keep your fingernails closely trimmed. This can help reduce injury from scratching. Take cool baths or apply cool, wet cloths to your skin. This can help reduce itching. General instructions Clean all items that you touched in the 3 days before you were diagnosed. This includes bedding, clothes, towels, and furniture. Do this on the same day that you start treatment. Dry-clean items or use hot water to wash them. Dry them on the hot dry cycle. Place items that cannot be washed into closed, airtight plastic bags for at least 3 days. The mites cannot live for more than 3 days away from  human skin. Vacuum your furniture and mattresses. Make sure that other people who may have been infested see a provider. Where to find more information Centers for Disease Control and Prevention (CDC): [employer]no Contact a health care provider if: You have itching that does not go away after 4 weeks of treatment. You keep getting new bumps or burrows. You have redness, swelling, or pain near your rash after treatment. You have fluid, blood, or pus coming from your rash. You get thick crusts or scaly patches over  large areas of your skin. You have a fever. This information is not intended to replace advice given to you by your health care provider. Make sure you discuss any questions you have with your health care provider. Document Revised: 12/02/2021 Document Reviewed: 12/02/2021 Elsevier Patient Education  2024 [employer].

## 2023-11-30 ENCOUNTER — Encounter: Payer: Self-pay | Admitting: Pediatrics

## 2023-11-30 NOTE — Assessment & Plan Note (Signed)
 Discussed weight management options including GLP-1 agonists and oral medications. GLP-1 agonists offer significant weight loss but have GI side effects. Oral options like phentermine are effective short-term with stimulant-like side effects. Insurance coverage will determine availability. - Submit insurance request for GLP-1 agonist coverage. - Consider phentermine if GLP-1 agonists are not covered. - Provide her with information on weight management options.

## 2023-12-01 ENCOUNTER — Ambulatory Visit
Admission: RE | Admit: 2023-12-01 | Discharge: 2023-12-01 | Disposition: A | Discharge status: Home / Self Care | Source: Ambulatory Visit | Attending: Pediatrics | Admitting: Pediatrics

## 2023-12-01 DIAGNOSIS — R2242 Localized swelling, mass and lump, left lower limb: Secondary | ICD-10-CM | POA: Diagnosis present

## 2023-12-24 ENCOUNTER — Other Ambulatory Visit (INDEPENDENT_AMBULATORY_CARE_PROVIDER_SITE_OTHER): Payer: Self-pay | Admitting: Vascular Surgery

## 2023-12-24 DIAGNOSIS — I83811 Varicose veins of right lower extremities with pain: Secondary | ICD-10-CM

## 2023-12-25 ENCOUNTER — Ambulatory Visit (INDEPENDENT_AMBULATORY_CARE_PROVIDER_SITE_OTHER)

## 2023-12-25 ENCOUNTER — Ambulatory Visit (INDEPENDENT_AMBULATORY_CARE_PROVIDER_SITE_OTHER): Payer: Self-pay | Admitting: Vascular Surgery

## 2023-12-25 VITALS — BP 125/82 | HR 62 | Resp 18 | Wt 145.0 lb

## 2023-12-25 DIAGNOSIS — I8002 Phlebitis and thrombophlebitis of superficial vessels of left lower extremity: Secondary | ICD-10-CM | POA: Diagnosis not present

## 2023-12-25 DIAGNOSIS — I83812 Varicose veins of left lower extremities with pain: Secondary | ICD-10-CM | POA: Diagnosis not present

## 2023-12-25 DIAGNOSIS — E785 Hyperlipidemia, unspecified: Secondary | ICD-10-CM | POA: Diagnosis not present

## 2023-12-25 DIAGNOSIS — I83813 Varicose veins of bilateral lower extremities with pain: Secondary | ICD-10-CM | POA: Diagnosis not present

## 2023-12-25 DIAGNOSIS — I10 Essential (primary) hypertension: Secondary | ICD-10-CM

## 2023-12-25 DIAGNOSIS — I809 Phlebitis and thrombophlebitis of unspecified site: Secondary | ICD-10-CM | POA: Insufficient documentation

## 2023-12-25 DIAGNOSIS — I83811 Varicose veins of right lower extremities with pain: Secondary | ICD-10-CM

## 2023-12-25 NOTE — Assessment & Plan Note (Signed)
 blood pressure control important in reducing the progression of atherosclerotic disease. On appropriate oral medications.

## 2023-12-25 NOTE — Assessment & Plan Note (Signed)
 lipid control important in reducing the progression of atherosclerotic disease. Continue statin therapy

## 2023-12-25 NOTE — Assessment & Plan Note (Signed)
 Patient had an episode of what sounds like superficial thrombophlebitis in the left lower leg.  This resolved after several weeks without any intervention.  This is better now and her duplex shows no active DVT or superficial thrombophlebitis.  She did not have large vessel venous reflux and no intervention would be planned.  We discussed the treatments if this is to happen again.  I will see her back as needed.

## 2023-12-25 NOTE — Assessment & Plan Note (Signed)
 A venous reflux study was performed today in the left lower extremity demonstrating no evidence of deep venous thrombosis, active superficial thrombophlebitis, or significant venous reflux in the left lower extremity.  It sounds like she had an episode of superficial thrombophlebitis that spontaneously resolved.  She does not have any significant reflux that would benefit from intervention.  We discussed continued exercise, elevation, and compression socks generally being helpful although they may have irritated her leg and help precipitate the superficial thrombophlebitis.  I will see her back as needed

## 2023-12-25 NOTE — Progress Notes (Signed)
 Patient ID: Cristina Aguilar, female   DOB: 05/17/1970, 53 y.o.   MRN: [id number]  Chief Complaint  Patient presents with   New Patient (Initial Visit)    Ref Herold consult varicose veins of right lew with pain    HPI Cristina Aguilar is a 53 y.o. female.  I am asked to see the patient by Dr. Herold for evaluation of painful varicosities of the lower extremities.  Few months ago, the patient had an episode where she developed a painful, prominent varicosity just below the medial left knee.  She had been wearing compression socks when this happened.  She denied any other trauma, injury, or clear aggravating factor.  This was very tender to the touch.  The vein became somewhat red and inflamed.  Over several weeks time, this resolved.  She does have residual varicosities in both lower extremities that can be painful locally.  She denies any known history of DVT personally or in her family.  A venous reflux study was performed today in the left lower extremity demonstrating no evidence of deep venous thrombosis, active superficial thrombophlebitis, or significant venous reflux in the left lower extremity.     Past Medical History:  Diagnosis Date   Allergy    Seasonal   Anxiety    Anxiety attack 07/19/2015   30 xanax q six months okay per pcp.     Family history of adverse reaction to anesthesia    Maternal aunt - slow to wake   GERD (gastroesophageal reflux disease)    Hyperlipidemia    Hypertension    Migraine headache    2-3 per year   Restless leg syndrome    Sciatica 10/21/2023    Past Surgical History:  Procedure Laterality Date   HERNIA REPAIR     TONSILLECTOMY     TUBAL LIGATION       Family History  Problem Relation Age of Onset   Heart disease Mother    Anxiety disorder Mother    Early death Mother    Cancer Father    Breast cancer Maternal Grandmother        mat great gm   Cancer Maternal Grandmother    Alcohol abuse Sister    Depression Sister    Asthma  Maternal Aunt       Social History   Tobacco Use   Smoking status: Former    Current packs/day: 1.00    Average packs/day: 1 pack/day for 17.3 years (17.3 ttl pk-yrs)    Types: Cigarettes    Start date: 03/10/1988   Smokeless tobacco: Never  Vaping Use   Vaping status: Never Used  Substance Use Topics   Alcohol use: No   Drug use: Never     Allergies  Allergen Reactions   Codeine Shortness Of Breath   Morphine And Codeine Other (See Comments)    Seizures    Sulfa Antibiotics Rash    Current Outpatient Medications  Medication Sig Dispense Refill   atorvastatin  (LIPITOR) 10 MG tablet TAKE ONE TABLET BY MOUTH EVERY DAY 30 tablet 1   cetirizine  (ZYRTEC ) 10 MG tablet Take 1 tablet (10 mg total) by mouth daily. 30 tablet 3   permethrin  (ELIMITE ) 5 % cream Apply to all areas of the body from the neck to soles of your feet; leave on for 8 to 14 hours before removing by washing off in either shower of bath.  May repeat in 14 days if living mites or bite marks are  observed. 60 g 2   semaglutide -weight management (WEGOVY ) 0.25 MG/0.5ML SOAJ SQ injection Inject 0.25 mg into the skin once a week. 2 mL 0   No current facility-administered medications for this visit.      REVIEW OF SYSTEMS (Negative unless checked)  Constitutional: [] Weight loss  [] Fever  [] Chills Cardiac: [] Chest pain   [] Chest pressure   [] Palpitations   [] Shortness of breath when laying flat   [] Shortness of breath at rest   [] Shortness of breath with exertion. Vascular:  [] Pain in legs with walking   [] Pain in legs at rest   [] Pain in legs when laying flat   [] Claudication   [] Pain in feet when walking  [] Pain in feet at rest  [] Pain in feet when laying flat   [] History of DVT   [x] Phlebitis   [] Swelling in legs   [x] Varicose veins   [] Non-healing ulcers Pulmonary:   [] Uses home oxygen   [] Productive cough   [] Hemoptysis   [] Wheeze  [] COPD   [] Asthma Neurologic:  [] Dizziness  [] Blackouts   [] Seizures   [] History  of stroke   [] History of TIA  [] Aphasia   [] Temporary blindness   [] Dysphagia   [] Weakness or numbness in arms   [] Weakness or numbness in legs Musculoskeletal:  [] Arthritis   [] Joint swelling   [] Joint pain   [] Low back pain Hematologic:  [] Easy bruising  [] Easy bleeding   [] Hypercoagulable state   [] Anemic  [] Hepatitis Gastrointestinal:  [] Blood in stool   [] Vomiting blood  [x] Gastroesophageal reflux/heartburn   [] Abdominal pain Genitourinary:  [] Chronic kidney disease   [] Difficult urination  [] Frequent urination  [] Burning with urination   [] Hematuria Skin:  [] Rashes   [] Ulcers   [] Wounds Psychological:  [x] History of anxiety   []  History of major depression.    Physical Exam BP 125/82   Pulse 62   Resp 18   Wt 145 lb (65.8 kg)   BMI 26.52 kg/m  Gen:  WD/WN, NAD. Appears younger than stated age. Head: [place]/AT, No temporalis wasting.  Ear/Nose/Throat: Hearing grossly intact, nares w/o erythema or drainage, oropharynx w/o Erythema/Exudate Eyes: Conjunctiva clear, sclera non-icteric  Neck: trachea midline.  No JVD.  Pulmonary:  Good air movement, respirations not labored, no use of accessory muscles  Cardiac: RRR, no JVD Vascular:  Vessel Right Left  Radial Palpable Palpable                                   Gastrointestinal:. No masses, surgical incisions, or scars. Musculoskeletal: M/S 5/5 throughout.  Extremities without ischemic changes.  No deformity or atrophy. Scattered varicosities bilaterally. No edema. Neurologic: Sensation grossly intact in extremities.  Symmetrical.  Speech is fluent. Motor exam as listed above. Psychiatric: Judgment intact, Mood & affect appropriate for pt's clinical situation. Dermatologic: No rashes or ulcers noted.  No cellulitis or open wounds.    Radiology US  Venous Img Lower Unilateral Left Result Date: 12/01/2023 CLINICAL DATA:  Intermittent swelling. Intermittent painful varicose veins. EXAM: LEFT LOWER EXTREMITY VENOUS DOPPLER  ULTRASOUND TECHNIQUE: Gray-scale sonography with compression, as well as color and duplex ultrasound, were performed to evaluate the deep venous system(s) from the level of the common femoral vein through the popliteal and proximal calf veins. COMPARISON:  None available FINDINGS: VENOUS Normal compressibility of the common femoral, superficial femoral, and popliteal veins, as well as the visualized calf veins. Visualized portions of profunda femoral vein and great saphenous vein unremarkable. No filling  defects to suggest DVT on grayscale or color Doppler imaging. Doppler waveforms show normal direction of venous flow, normal respiratory plasticity and response to augmentation. Limited views of the contralateral common femoral vein are unremarkable. OTHER None. Limitations: none IMPRESSION: No DVT of the left lower extremity. Electronically Signed   By: Aliene Lloyd M.D.   On: 12/01/2023 11:13    Labs Recent Results (from the past 2160 hours)  D-dimer, quantitative     Status: None   Collection Time: 10/20/23  8:38 PM  Result Value Ref Range   D-Dimer, Quant 0.32 0.00 - 0.50 ug/mL-FEU    Comment: (NOTE) At the manufacturer cut-off value of 0.5 g/mL FEU, this assay has a negative predictive value of 95-100%.This assay is intended for use in conjunction with a clinical pretest probability (PTP) assessment model to exclude pulmonary embolism (PE) and deep venous thrombosis (DVT) in outpatients suspected of PE or DVT. Results should be correlated with clinical presentation. Performed at [facility], [address] [place], [place] [address]   hCG, quantitative, pregnancy     Status: None   Collection Time: 10/20/23  9:00 PM  Result Value Ref Range   hCG, Beta Chain, Quant, S 1 <5 mIU/mL    Comment:          GEST. AGE      CONC.  (mIU/mL)   <=1 WEEK        5 - 50     2 WEEKS       50 - 500     3 WEEKS       100 - 10,000     4 WEEKS     1,000 - 30,000     5 WEEKS     3,500 -  115,000   6-8 WEEKS     12,000 - 270,000    12 WEEKS     15,000 - 220,000        FEMALE AND NON-PREGNANT FEMALE:     LESS THAN 5 mIU/mL Performed at [facility], [address], [place], [place] [address]   Magnesium     Status: None   Collection Time: 10/20/23  9:00 PM  Result Value Ref Range   Magnesium 2.2 1.7 - 2.4 mg/dL    Comment: Performed at [facility], [address] [place], [place] [address]  Phosphorus     Status: Abnormal   Collection Time: 10/20/23  9:00 PM  Result Value Ref Range   Phosphorus 2.2 (L) 2.5 - 4.6 mg/dL    Comment: Performed at [facility], [address] [place], [place] [address]  Protime-INR     Status: None   Collection Time: 10/20/23  9:00 PM  Result Value Ref Range   Prothrombin Time 13.4 11.4 - 15.2 seconds   INR 1.0 0.8 - 1.2    Comment: (NOTE) INR goal varies based on device and disease states. Performed at [facility], [address] [place], [place] [address]   APTT     Status: None   Collection Time: 10/20/23  9:00 PM  Result Value Ref Range   aPTT 30 24 - 36 seconds    Comment: Performed at [facility], [address] [place], [place] [address]  Lipid panel     Status: Abnormal   Collection Time: 10/20/23  9:00 PM  Result Value Ref Range   Cholesterol 189 0 - 200 mg/dL   Triglycerides 663 (H) <150 mg/dL   HDL  40 (L) >40 mg/dL   Total CHOL/HDL Ratio 4.7 RATIO   VLDL 67 (H) 0 - 40 mg/dL   LDL Cholesterol 82 0 - 99 mg/dL    Comment:        Total Cholesterol/HDL:CHD Risk Coronary Heart Disease Risk Table                     Men   Women  1/2 Average Risk   3.4   3.3  Average Risk       5.0   4.4  2 X Average Risk   9.6   7.1  3 X Average Risk  23.4   11.0        Use the calculated Patient Ratio above and the CHD Risk Table to determine the patient's CHD Risk.        ATP III CLASSIFICATION (LDL):  <100     mg/dL   Optimal  899-870  mg/dL   Near or Above                     Optimal  130-159  mg/dL   Borderline  839-810  mg/dL   High  >809     mg/dL   Very High Performed at [facility], [address] [place], [place] [address]   Comprehensive metabolic panel     Status: Abnormal   Collection Time: 10/20/23  9:02 PM  Result Value Ref Range   Sodium 140 135 - 145 mmol/L   Potassium 3.3 (L) 3.5 - 5.1 mmol/L   Chloride 106 98 - 111 mmol/L   CO2 24 22 - 32 mmol/L   Glucose, Bld 112 (H) 70 - 99 mg/dL    Comment: Glucose reference range applies only to samples taken after fasting for at least 8 hours.   BUN 15 6 - 20 mg/dL   Creatinine, Ser 9.29 0.44 - 1.00 mg/dL   Calcium  10.3 8.9 - 10.3 mg/dL   Total Protein 6.7 6.5 - 8.1 g/dL   Albumin 3.7 3.5 - 5.0 g/dL   AST 33 15 - 41 U/L   ALT 30 0 - 44 U/L   Alkaline Phosphatase 60 38 - 126 U/L   Total Bilirubin 0.5 0.0 - 1.2 mg/dL   GFR, Estimated >39 >39 mL/min    Comment: (NOTE) Calculated using the CKD-EPI Creatinine Equation (2021)    Anion gap 10 5 - 15    Comment: Performed at [facility], [address], [place], [place] [address]  Troponin I (High Sensitivity)     Status: None   Collection Time: 10/20/23  9:02 PM  Result Value Ref Range   Troponin I (High Sensitivity) 3 <18 ng/L    Comment: (NOTE) Elevated high sensitivity troponin I (hsTnI) values and significant  changes across serial measurements may suggest ACS but many other  chronic and acute conditions are known to elevate hsTnI results.  Refer to the Links section for chest pain algorithms and additional  guidance. Performed at [facility], [address] [place], [place] [address]   CBC with Differential     Status: None   Collection Time: 10/20/23  9:02 PM  Result Value Ref Range   WBC 7.4 4.0 - 10.5 K/uL   RBC 4.72 3.87 - 5.11 MIL/uL   Hemoglobin 13.5 12.0 - 15.0 g/dL   HCT 58.9 63.9 - 53.9 %   MCV 86.9 80.0 - 100.0 fL   MCH 28.6 26.0 - 34.0  pg   MCHC 32.9 30.0 - 36.0 g/dL   RDW 86.6 88.4  - 84.4 %   Platelets 287 150 - 400 K/uL   nRBC 0.0 0.0 - 0.2 %   Neutrophils Relative % 64 %   Neutro Abs 4.8 1.7 - 7.7 K/uL   Lymphocytes Relative 25 %   Lymphs Abs 1.8 0.7 - 4.0 K/uL   Monocytes Relative 8 %   Monocytes Absolute 0.6 0.1 - 1.0 K/uL   Eosinophils Relative 2 %   Eosinophils Absolute 0.2 0.0 - 0.5 K/uL   Basophils Relative 1 %   Basophils Absolute 0.1 0.0 - 0.1 K/uL   Immature Granulocytes 0 %   Abs Immature Granulocytes 0.02 0.00 - 0.07 K/uL    Comment: Performed at [facility], [address] [place], [place] [address]  Hemoglobin A1c     Status: None   Collection Time: 10/20/23  9:02 PM  Result Value Ref Range   Hgb A1c MFr Bld 5.6 4.8 - 5.6 %    Comment: (NOTE)         Prediabetes: 5.7 - 6.4         Diabetes: >6.4         Glycemic control for adults with diabetes: <7.0    Mean Plasma Glucose 114 mg/dL    Comment: (NOTE) Performed At: [facility] [address] [place], [place] [id number] Jennette Shorter MD Ey:[id number]   Troponin I (High Sensitivity)     Status: None   Collection Time: 10/20/23 11:12 PM  Result Value Ref Range   Troponin I (High Sensitivity) 3 <18 ng/L    Comment: (NOTE) Elevated high sensitivity troponin I (hsTnI) values and significant  changes across serial measurements may suggest ACS but many other  chronic and acute conditions are known to elevate hsTnI results.  Refer to the Links section for chest pain algorithms and additional  guidance. Performed at [facility], [address] [place], [place] [address]   HIV Antibody (routine testing w rflx)     Status: None   Collection Time: 10/21/23  2:03 AM  Result Value Ref Range   HIV Screen 4th Generation wRfx Non Reactive Non Reactive    Comment: Performed at [facility], [address] [place], [place] [address]  CBC     Status: None   Collection Time: 10/21/23  2:03 AM  Result Value Ref Range   WBC 7.4 4.0 - 10.5 K/uL   RBC 4.42  3.87 - 5.11 MIL/uL   Hemoglobin 12.5 12.0 - 15.0 g/dL   HCT 62.2 63.9 - 53.9 %   MCV 85.3 80.0 - 100.0 fL   MCH 28.3 26.0 - 34.0 pg   MCHC 33.2 30.0 - 36.0 g/dL   RDW 86.6 88.4 - 84.4 %   Platelets 270 150 - 400 K/uL   nRBC 0.0 0.0 - 0.2 %    Comment: Performed at [facility], [address] [place], [place] [address]  Basic metabolic panel     Status: Abnormal   Collection Time: 10/21/23  2:03 AM  Result Value Ref Range   Sodium 140 135 - 145 mmol/L   Potassium 3.8 3.5 - 5.1 mmol/L   Chloride 106 98 - 111 mmol/L   CO2 24 22 - 32 mmol/L   Glucose, Bld 104 (H) 70 - 99 mg/dL    Comment: Glucose reference range applies only to samples taken after fasting for at least 8 hours.   BUN  12 6 - 20 mg/dL   Creatinine, Ser 9.31 0.44 - 1.00 mg/dL   Calcium  9.3 8.9 - 10.3 mg/dL   GFR, Estimated >39 >39 mL/min    Comment: (NOTE) Calculated using the CKD-EPI Creatinine Equation (2021)    Anion gap 10 5 - 15    Comment: Performed at [facility], [address] [place], [place] [address]  Metanephrines, plasma     Status: None   Collection Time: 10/21/23  2:03 AM  Result Value Ref Range   Normetanephrine, Free 62.1 0.0 - 244.0 pg/mL    Comment: (NOTE) This test was developed and its performance characteristics determined by [facility]. It has not been cleared or approved by the Food and Drug Administration.    Metanephrine, Free 61.3 0.0 - 88.0 pg/mL    Comment: (NOTE) This test was developed and its performance characteristics determined by [facility]. It has not been cleared or approved by the Food and Drug Administration. Performed At: [facility] [address] [place], [place] [id number] Jennette Shorter MD Ey:[id number]   Troponin I (High Sensitivity)     Status: None   Collection Time: 10/21/23  2:03 AM  Result Value Ref Range   Troponin I (High Sensitivity) 3 <18 ng/L    Comment: (NOTE) Elevated high sensitivity troponin I (hsTnI) values and  significant  changes across serial measurements may suggest ACS but many other  chronic and acute conditions are known to elevate hsTnI results.  Refer to the Links section for chest pain algorithms and additional  guidance. Performed at [facility], [address] [place], [place] [address]   TSH     Status: None   Collection Time: 10/21/23  3:44 PM  Result Value Ref Range   TSH 1.840 0.450 - 4.500 uIU/mL  Magnesium     Status: None   Collection Time: 10/21/23  3:44 PM  Result Value Ref Range   Magnesium 2.3 1.6 - 2.3 mg/dL  Lipid Profile     Status: Abnormal   Collection Time: 10/21/23  3:44 PM  Result Value Ref Range   Cholesterol, Total 195 100 - 199 mg/dL   Triglycerides 836 (H) 0 - 149 mg/dL   HDL 48 >60 mg/dL   VLDL Cholesterol Cal 29 5 - 40 mg/dL   LDL Chol Calc (NIH) 881 (H) 0 - 99 mg/dL   Chol/HDL Ratio 4.1 0.0 - 4.4 ratio    Comment:                                   T. Chol/HDL Ratio                                             Men  Women                               1/2 Avg.Risk  3.4    3.3                                   Avg.Risk  5.0    4.4  2X Avg.Risk  9.6    7.1                                3X Avg.Risk 23.4   11.0   B12     Status: None   Collection Time: 10/21/23  3:44 PM  Result Value Ref Range   Vitamin B-12 512 232 - 1,245 pg/mL  CBC w/Diff     Status: Abnormal   Collection Time: 10/21/23  3:44 PM  Result Value Ref Range   WBC 6.9 3.4 - 10.8 x10E3/uL   RBC 5.34 (H) 3.77 - 5.28 x10E6/uL   Hemoglobin 14.7 11.1 - 15.9 g/dL   Hematocrit 53.9 65.9 - 46.6 %   MCV 86 79 - 97 fL   MCH 27.5 26.6 - 33.0 pg   MCHC 32.0 31.5 - 35.7 g/dL   RDW 86.6 88.2 - 84.5 %   Platelets 301 150 - 450 x10E3/uL   Neutrophils 65 Not Estab. %   Lymphs 24 Not Estab. %   Monocytes 8 Not Estab. %   Eos 2 Not Estab. %   Basos 1 Not Estab. %   Neutrophils Absolute 4.4 1.4 - 7.0 x10E3/uL   Lymphocytes Absolute 1.7 0.7 - 3.1  x10E3/uL   Monocytes Absolute 0.5 0.1 - 0.9 x10E3/uL   EOS (ABSOLUTE) 0.1 0.0 - 0.4 x10E3/uL   Basophils Absolute 0.1 0.0 - 0.2 x10E3/uL   Immature Granulocytes 0 Not Estab. %   Immature Grans (Abs) 0.0 0.0 - 0.1 x10E3/uL  Cytology - PAP     Status: Abnormal   Collection Time: 11/18/23  2:07 PM  Result Value Ref Range   High risk HPV Negative    Adequacy      Satisfactory for evaluation; transformation zone component PRESENT.   Diagnosis - Low grade squamous intraepithelial lesion (LSIL) (A)    Comment Normal Reference Range HPV - Negative     Assessment/Plan:  Hypertension blood pressure control important in reducing the progression of atherosclerotic disease. On appropriate oral medications.   Hyperlipidemia lipid control important in reducing the progression of atherosclerotic disease. Continue statin therapy   Superficial thrombophlebitis Patient had an episode of what sounds like superficial thrombophlebitis in the left lower leg.  This resolved after several weeks without any intervention.  This is better now and her duplex shows no active DVT or superficial thrombophlebitis.  She did not have large vessel venous reflux and no intervention would be planned.  We discussed the treatments if this is to happen again.  I will see her back as needed.  Varicose veins of both lower extremities with pain A venous reflux study was performed today in the left lower extremity demonstrating no evidence of deep venous thrombosis, active superficial thrombophlebitis, or significant venous reflux in the left lower extremity.  It sounds like she had an episode of superficial thrombophlebitis that spontaneously resolved.  She does not have any significant reflux that would benefit from intervention.  We discussed continued exercise, elevation, and compression socks generally being helpful although they may have irritated her leg and help precipitate the superficial thrombophlebitis.  I will see her  back as needed      Selinda Gu 12/25/2023, 8:51 AM   This note was created with Dragon medical transcription system.  Any errors from dictation are unintentional.

## 2023-12-28 ENCOUNTER — Ambulatory Visit: Payer: Self-pay

## 2023-12-28 NOTE — Telephone Encounter (Signed)
 See mychart message as well from patient.

## 2023-12-28 NOTE — Telephone Encounter (Signed)
 FYI Only or Action Required?: FYI only for provider.  Patient was last seen in primary care on 11/26/2023 by Valerio Melanie DASEN, NP.  Called Nurse Triage reporting Medication Reaction.  Symptoms began today.  Interventions attempted: Nothing.  Symptoms are: stable.  Triage Disposition: Information or Advice Only Call  Patient/caregiver understands and will follow disposition?: Yes                            Copied from CRM #[id number]. Topic: Clinical - Red Word Triage >> Dec 28, 2023  2:10 PM Fonda T wrote: [facility] that prompted transfer to Nurse Triage: Received call from patient, reports she thinks she is having a allergic reaction from medication, Wegovy .  Patient states as soon as she gave herself the injection, today, about 15 minutes ago, she suddenly began having a stinging sensation on her right leg, and now has become red. Reason for Disposition  Caller has medicine question only, adult not sick, AND triager answers question  Answer Assessment - Initial Assessment Questions 1. NAME of MEDICINE: What medicine(s) are you calling about?     Wegovy   2. QUESTION: What is your question? (e.g., double dose of medicine, side effect)     Side effect 3. PRESCRIBER: Who prescribed the medicine? Reason: if prescribed by specialist, call should be referred to that group.     PCP 4. SYMPTOMS: Do you have any symptoms? If Yes, ask: What symptoms are you having?  How bad are the symptoms (e.g., mild, moderate, severe)     Redness about size of quarter on right leg where she administered Wegovy  injection 5. PREGNANCY:  Is there any chance that you are pregnant? When was your last menstrual period?     N/A    Patient called in to report redness at site where she administered her Wegovy  injection today. Patient stated this is her 4th dose and this is the first time these symptoms have occurred. Patient denied the following: difficulty breathing, rash,  hives, swelling of airway, itching and pain. This RN advised patient to monitor symptoms at home at this time. This RN advised patient to call back if symptoms worsen or persist for more than a few days. Patient verbalized understanding.  Protocols used: Medication Question Call-A-AH

## 2023-12-30 ENCOUNTER — Telehealth: Payer: Self-pay | Admitting: Pediatrics

## 2023-12-30 NOTE — Telephone Encounter (Signed)
 Called patient back to let her know that she can stay here as a patient if she wants and can be scheduled with Jolene or Darice, will need a transfer of care appt to start seeing one of them.

## 2023-12-30 NOTE — Telephone Encounter (Signed)
 Copied from CRM [id number]. Topic: Appointments - Scheduling Inquiry for Clinic >> Dec 30, 2023 11:58 AM Terri MATSU wrote: Reason for CRM: Patient received a msg stating Dr.Santos will be leaving and if she wants to continued her care they offer her to go to another locatin. PER CAL  it's for quicker care but patient is asking can she stay at this location and just see another doctor and can someone call and give her more information on it. Callback number [phone] best time to call after 12pm.

## 2024-01-04 ENCOUNTER — Other Ambulatory Visit: Payer: Self-pay | Admitting: Pediatrics

## 2024-01-04 DIAGNOSIS — I10 Essential (primary) hypertension: Secondary | ICD-10-CM

## 2024-01-04 DIAGNOSIS — R7303 Prediabetes: Secondary | ICD-10-CM

## 2024-01-04 DIAGNOSIS — Z6827 Body mass index (BMI) 27.0-27.9, adult: Secondary | ICD-10-CM

## 2024-01-04 NOTE — Telephone Encounter (Unsigned)
 Copied from CRM [id number]. Topic: Clinical - Medication Refill >> Jan 04, 2024  1:33 PM Antony S wrote: Medication: semaglutide -weight management (WEGOVY ) 0.25 MG/0.5ML SOAJ SQ injection  Has the patient contacted their pharmacy? Yes (Agent: If no, request that the patient contact the pharmacy for the refill. If patient does not wish to contact the pharmacy document the reason why and proceed with request.) (Agent: If yes, when and what did the pharmacy advise?)  This is the patient's preferred pharmacy:  [facility] - [place], [place] - [address] [place] [address] Phone: [phone] Fax: [phone]  Is this the correct pharmacy for this prescription? Yes If no, delete pharmacy and type the correct one.   Has the prescription been filled recently? Yes  Is the patient out of the medication? Yes  Has the patient been seen for an appointment in the last year OR does the patient have an upcoming appointment? Yes  Can we respond through MyChart? Yes  Agent: Please be advised that Rx refills may take up to 3 business days. We ask that you follow-up with your pharmacy.

## 2024-01-06 MED ORDER — WEGOVY 0.25 MG/0.5ML ~~LOC~~ SOAJ: 0.2500 mg | SUBCUTANEOUS | 3 refills | Status: AC

## 2024-01-06 NOTE — Telephone Encounter (Signed)
 Requested Prescriptions  Pending Prescriptions Disp Refills   semaglutide -weight management (WEGOVY ) 0.25 MG/0.5ML SOAJ SQ injection 2 mL 3    Sig: Inject 0.25 mg into the skin once a week.     Endocrinology:  Diabetes - GLP-1 Receptor Agonists - semaglutide  Passed - 01/06/2024  8:21 AM      Passed - HBA1C in normal range and within 180 days    Hgb A1c MFr Bld  Date Value Ref Range Status  10/20/2023 5.6 4.8 - 5.6 % Final    Comment:    (NOTE)         Prediabetes: 5.7 - 6.4         Diabetes: >6.4         Glycemic control for adults with diabetes: <7.0          Passed - Cr in normal range and within 360 days    Creatinine, Ser  Date Value Ref Range Status  10/21/2023 0.68 0.44 - 1.00 mg/dL Final         Passed - Valid encounter within last 6 months    Recent Outpatient Visits           1 month ago Scabies   [place] [facility] [place], Brady T, NP   1 month ago Localized swelling of left lower extremity   [place] [facility] Herold Hadassah SQUIBB, MD   2 months ago Depression with anxiety   [place] [facility] Herold Hadassah SQUIBB, MD   7 months ago Perimenopause   [place] [facility] Herold Hadassah SQUIBB, MD

## 2024-01-12 ENCOUNTER — Other Ambulatory Visit: Payer: Self-pay | Admitting: Pediatrics

## 2024-01-12 DIAGNOSIS — E7800 Pure hypercholesterolemia, unspecified: Secondary | ICD-10-CM

## 2024-01-12 NOTE — Telephone Encounter (Unsigned)
 Copied from CRM #[id number]. Topic: Clinical - Medication Refill >> Jan 12, 2024  2:57 PM Sophia H wrote: Medication: atorvastatin  (LIPITOR) 10 MG tablet   Has the patient contacted their pharmacy? Yes, stated no response from clinic.   This is the patient's preferred pharmacy:  [facility] - [place], [place] - [address] [place] [address] Phone: [phone] Fax: [phone]  Is this the correct pharmacy for this prescription? Yes If no, delete pharmacy and type the correct one.   Has the prescription been filled recently? Yes  Is the patient out of the medication? Yes  Has the patient been seen for an appointment in the last year OR does the patient have an upcoming appointment? Yes, seen back in September   Can we respond through MyChart? Yes  Agent: Please be advised that Rx refills may take up to 3 business days. We ask that you follow-up with your pharmacy.

## 2024-01-14 ENCOUNTER — Encounter: Payer: Self-pay | Admitting: Nurse Practitioner

## 2024-01-14 ENCOUNTER — Telehealth: Admitting: Nurse Practitioner

## 2024-01-14 DIAGNOSIS — H1033 Unspecified acute conjunctivitis, bilateral: Secondary | ICD-10-CM

## 2024-01-14 MED ORDER — POLYMYXIN B-TRIMETHOPRIM 10000-0.1 UNIT/ML-% OP SOLN
1.0000 [drp] | OPHTHALMIC | 0 refills | Status: AC
Start: 2024-01-14 — End: 2024-01-21

## 2024-01-14 MED ORDER — ATORVASTATIN CALCIUM 10 MG PO TABS
10.0000 mg | ORAL_TABLET | Freq: Every day | ORAL | 1 refills | Status: AC
Start: 2024-01-14 — End: ?

## 2024-01-14 MED ORDER — FLUTICASONE PROPIONATE 50 MCG/ACT NA SUSP: 2.0000 | Freq: Every day | NASAL | 6 refills | Status: AC

## 2024-01-14 NOTE — Telephone Encounter (Signed)
 Rx 01/14/24 #30 1RF- duplicate request Requested Prescriptions  Pending Prescriptions Disp Refills   atorvastatin  (LIPITOR) 10 MG tablet [Pharmacy Med Name: atorvastatin  10 mg tablet] 30 tablet 1    Sig: TAKE ONE TABLET BY MOUTH EVERY DAY     Cardiovascular:  Antilipid - Statins Failed - 01/14/2024  1:15 PM      Failed - Lipid Panel in normal range within the last 12 months    Cholesterol, Total  Date Value Ref Range Status  10/21/2023 195 100 - 199 mg/dL Final   LDL Chol Calc (NIH)  Date Value Ref Range Status  10/21/2023 118 (H) 0 - 99 mg/dL Final   HDL  Date Value Ref Range Status  10/21/2023 48 >39 mg/dL Final   Triglycerides  Date Value Ref Range Status  10/21/2023 163 (H) 0 - 149 mg/dL Final         Passed - Patient is not pregnant      Passed - Valid encounter within last 12 months    Recent Outpatient Visits           Today Acute bacterial conjunctivitis of both eyes   [facility] Melvin Pao, NP   1 month ago Scabies   [place] [facility] [place], [place] T, NP   1 month ago Localized swelling of left lower extremity   [place] [facility] Herold Hadassah SQUIBB, MD   2 months ago Depression with anxiety   [place] [facility] Herold Hadassah SQUIBB, MD   7 months ago Perimenopause    [facility] Herold Hadassah SQUIBB, MD

## 2024-01-14 NOTE — Telephone Encounter (Signed)
 Requested Prescriptions  Pending Prescriptions Disp Refills   atorvastatin  (LIPITOR) 10 MG tablet 30 tablet 1    Sig: Take 1 tablet (10 mg total) by mouth daily.     Cardiovascular:  Antilipid - Statins Failed - 01/14/2024 12:55 PM      Failed - Lipid Panel in normal range within the last 12 months    Cholesterol, Total  Date Value Ref Range Status  10/21/2023 195 100 - 199 mg/dL Final   LDL Chol Calc (NIH)  Date Value Ref Range Status  10/21/2023 118 (H) 0 - 99 mg/dL Final   HDL  Date Value Ref Range Status  10/21/2023 48 >39 mg/dL Final   Triglycerides  Date Value Ref Range Status  10/21/2023 163 (H) 0 - 149 mg/dL Final         Passed - Patient is not pregnant      Passed - Valid encounter within last 12 months    Recent Outpatient Visits           Today Acute bacterial conjunctivitis of both eyes   [place] [facility] Melvin Pao, NP   1 month ago Scabies   [place] [facility] [place], [place] T, NP   1 month ago Localized swelling of left lower extremity   [place] [facility] Herold Hadassah SQUIBB, MD   2 months ago Depression with anxiety   [place] [facility] Herold Hadassah SQUIBB, MD   7 months ago Perimenopause   [place] [facility] Herold Hadassah SQUIBB, MD

## 2024-01-14 NOTE — Progress Notes (Signed)
 There were no vitals taken for this visit.   Subjective:    Patient ID: Cristina Aguilar, female    DOB: [birth year], 53 y.o.   MRN: [id number]  HPI: Cristina Aguilar is a 53 y.o. female  Chief Complaint  Patient presents with   Eye Problem    Patient states she has been experiencing eye irritation in both eyes for the last 2 days. States that her eyes do itch and are swelling a little bit. Also states she noticed some crust in her eyes when waking up today as well.    Patient states she has been having some eye irritation in both eyes for the last two days.  Having redness, waking up with her lid crusted, itching, and some swelling.  Also having some congestion, runny nose.  Denies any fever and sore throat.   Relevant past medical, surgical, family and social history reviewed and updated as indicated. Interim medical history since our last visit reviewed. Allergies and medications reviewed and updated.  Review of Systems  Constitutional:  Negative for fever.  HENT:  Positive for congestion and rhinorrhea. Negative for sore throat.   Eyes:  Positive for discharge, redness and itching.    Per HPI unless specifically indicated above     Objective:    There were no vitals taken for this visit.  Wt Readings from Last 3 Encounters:  12/25/23 145 lb (65.8 kg)  11/18/23 148 lb 6.4 oz (67.3 kg)  10/21/23 145 lb 6.4 oz (66 kg)    Physical Exam Vitals and nursing note reviewed.  HENT:     Head: Normocephalic.     Right Ear: Hearing normal.     Left Ear: Hearing normal.     Nose: Nose normal.  Eyes:     Pupils: Pupils are equal, round, and reactive to light.  Pulmonary:     Effort: Pulmonary effort is normal. No respiratory distress.  Neurological:     Mental Status: She is alert.  Psychiatric:        Mood and Affect: Mood normal.        Behavior: Behavior normal.        Thought Content: Thought content normal.        Judgment: Judgment normal.     Results for orders  placed or performed in visit on 11/18/23  Cytology - PAP   Collection Time: 11/18/23  2:07 PM  Result Value Ref Range   High risk HPV Negative    Adequacy      Satisfactory for evaluation; transformation zone component PRESENT.   Diagnosis - Low grade squamous intraepithelial lesion (LSIL) (A)    Comment Normal Reference Range HPV - Negative       Assessment & Plan:   Problem List Items Addressed This Visit   None    Follow up plan: No follow-ups on file.   This visit was completed via MyChart due to the restrictions of the COVID-19 pandemic. All issues as above were discussed and addressed. Physical exam was done as above through visual confirmation on MyChart. If it was felt that the patient should be evaluated in the office, they were directed there. The patient verbally consented to this visit. Location of the patient: Home Location of the provider: Office Those involved with this call:  Provider: Darice Petty, NP CMA: Laymon Metro, CMA Front Desk/Registration: Claretta Maiden This encounter was conducted via video.  I spent 20 minutes dedicated to the care of this patient on the  date of this encounter to include previsit review of plan of care, follow up , face to face time with the patient, and post visit ordering of testing.

## 2024-01-29 ENCOUNTER — Ambulatory Visit: Payer: Self-pay

## 2024-01-29 NOTE — Telephone Encounter (Signed)
 FYI Only or Action Required?: FYI only for provider: Going to UC for symptoms.  Patient was last seen in primary care on 01/14/2024 by Melvin Pao, NP.  Called Nurse Triage reporting Cough.  Symptoms began a week ago.  Interventions attempted: Prescription medications: Antibiotics.  Symptoms are: gradually worsening.  Triage Disposition: See Physician Within 24 Hours  Patient/caregiver understands and will follow disposition?: Yes            Copied from CRM #[id number]. Topic: Clinical - Red Word Triage >> Jan 29, 2024  7:56 AM Charlet HERO wrote: Red Word that prompted transfer to Nurse Triage: Patient is calling about being treated for bronchitis and she was given antibitics but it is not helped. Dr. Herold Reason for Disposition  SEVERE coughing spells (e.g., whooping sound after coughing, vomiting after coughing)  Answer Assessment - Initial Assessment Questions 1. ONSET: When did the cough begin?      01/20/2024  2. SEVERITY: How bad is the cough today?      Severe 3. SPUTUM: Describe the color of your sputum (e.g., none, dry cough; clear, white, yellow, green)     Dry  4. HEMOPTYSIS: Are you coughing up any blood? If Yes, ask: How much? (e.g., flecks, streaks, tablespoons, etc.)     Denies  5. DIFFICULTY BREATHING: Are you having difficulty breathing? If Yes, ask: How bad is it? (e.g., mild, moderate, severe)      Some tightness in chest had to use inhaler for symptoms.  6. FEVER: Do you have a fever? If Yes, ask: What is your temperature, how was it measured, and when did it start?     Denies  7. OTHER SYMPTOMS: Do you have any other symptoms? (e.g., runny nose, wheezing, chest pain)       Headache, body aches  Prednisone and z pak for symptoms from UC. Pt states she usually needs 2 rounds of antibiotics for symptoms. Pt going to UC for symptoms due to no morning availability in office.  Protocols used: Cough - Acute Non-Productive-A-AH

## 2024-02-02 ENCOUNTER — Telehealth: Payer: Self-pay

## 2024-02-02 NOTE — Telephone Encounter (Signed)
 Please review patient's chart to see if she would be a good candidate for HRT treatment. Patient is established with [facility]

## 2024-02-02 NOTE — Telephone Encounter (Signed)
 Copied from CRM [id number]. Topic: Clinical - Medical Advice >> Feb 02, 2024  2:04 PM Winona R wrote: Pt would like t o know if Evalene butler would support her with HRT creams for menopause if she establish care with her as her current pcp is leaving the practice. Please contact the pt when you have confirmed. Pt is interested in scheduling a new pt appointment if HRT is an option with [facility].

## 2024-02-09 ENCOUNTER — Ambulatory Visit: Payer: Self-pay

## 2024-02-09 ENCOUNTER — Other Ambulatory Visit: Payer: Self-pay | Admitting: Pediatrics

## 2024-02-09 DIAGNOSIS — H1030 Unspecified acute conjunctivitis, unspecified eye: Secondary | ICD-10-CM

## 2024-02-09 MED ORDER — POLYMYXIN B-TRIMETHOPRIM 10000-0.1 UNIT/ML-% OP SOLN: OPHTHALMIC | 0 refills | Status: DC

## 2024-02-09 NOTE — Progress Notes (Signed)
 Sent polytrim  for conjunctivitis recurrence.  Hadassah SHAUNNA Nett, MD

## 2024-02-09 NOTE — Telephone Encounter (Signed)
 This RN made first attempt to triage patient, no answer, unable to leave voicemail due to full voicemail box. Routing for additional attempts.    Copied from CRM #[id number]. Topic: Clinical - Medication Question >> Feb 09, 2024  1:02 PM Cleave MATSU wrote: Reason for CRM: pt wants to know if you could send over a refill for the polymyxin she also sent a message in mychart

## 2024-02-09 NOTE — Telephone Encounter (Signed)
 FYI Only or Action Required?: Action required by provider: clinical question for provider.  Patient was last seen in primary care on 01/14/2024 by Melvin Pao, NP.  Called Nurse Triage reporting Eye Drainage.  Symptoms began several days ago.  Interventions attempted: Other: cleansing.  Symptoms are: .gradually worsening  Triage Disposition: Call PCP Within 24 Hours  Patient/caregiver understands and will follow disposition?: No, wishes to speak with PCP    Reason for Disposition  [1] Bacterial conjunctivitis suspected (e.g., white, yellow or green discharge nearly all day long; or eyelids stuck shut from discharge after sleep) AND [2] NO doctor standing order to call in antibiotic eye drops  (Exception: [place]; continue triage.)  Answer Assessment - Initial Assessment Questions Additional info: Few weeks ago treated for pink eye with polymyxin. She never threw away her eye makeup and has been using this. She realized she reinfected her eye and is requesting for pcp to call in eye drops for conjunctivitis to  [facility] in [place].    1. EYE DISCHARGE: Is the discharge in one or both eyes? What color is it? How much is there? When did the discharge start?      Left eye with mild drainage 2. REDNESS OF SCLERA: Is there redness in the white of the eye? If Yes, ask: Is it in one or both eyes? When did the redness start?     Yes  3. EYELIDS: Are the eyelids red or swollen? If Yes, ask: How much?      no 4. VISION: Do you have blurred vision?     Only when discharge is thick  5. PAIN: Is there any pain? If Yes, ask: How bad is the pain? (Scale 0-10; or none, mild, moderate, severe)     tender 6. CONTACT LENS: Do you wear contacts?      7. OTHER SYMPTOMS: Do you have any other symptoms? (e.g., fever, runny nose, cough)     Itchy eye 8. PREGNANCY: Is there any chance you are pregnant? When was your last menstrual  period?  Protocols used: Eye - Pus or Discharge-A-AH

## 2024-02-17 ENCOUNTER — Ambulatory Visit: Admitting: Pediatrics

## 2024-02-17 VITALS — BP 116/77 | HR 80 | Temp 98.2°F | Ht 61.0 in | Wt 148.2 lb

## 2024-02-17 DIAGNOSIS — Z6828 Body mass index (BMI) 28.0-28.9, adult: Secondary | ICD-10-CM

## 2024-02-17 DIAGNOSIS — J40 Bronchitis, not specified as acute or chronic: Secondary | ICD-10-CM | POA: Diagnosis not present

## 2024-02-17 DIAGNOSIS — N951 Menopausal and female climacteric states: Secondary | ICD-10-CM | POA: Diagnosis not present

## 2024-02-17 MED ORDER — GABAPENTIN 100 MG PO CAPS: 100.0000 mg | ORAL_CAPSULE | Freq: Two times a day (BID) | ORAL | 3 refills | Status: DC | PRN

## 2024-02-17 MED ORDER — METHYLPREDNISOLONE 4 MG PO TBPK: ORAL_TABLET | ORAL | 0 refills | Status: DC

## 2024-02-17 MED ORDER — ALBUTEROL SULFATE HFA 108 (90 BASE) MCG/ACT IN AERS: 2.0000 | INHALATION_SPRAY | Freq: Four times a day (QID) | RESPIRATORY_TRACT | 0 refills | Status: AC | PRN

## 2024-02-17 NOTE — Progress Notes (Unsigned)
 Office Visit  BP 116/77   Pulse 80   Temp 98.2 F (36.8 C) (Oral)   Ht 5' 1 (1.549 m)   Wt 148 lb 3.2 oz (67.2 kg)   LMP 02/08/2024 (Approximate)   SpO2 98%   BMI 28.00 kg/m    Subjective:    Patient ID: Cristina Aguilar, female    DOB: 09-Dec-1970, 53 y.o.   MRN: [id number]  HPI: Cristina Aguilar is a 53 y.o. female  Chief Complaint  Patient presents with   Depression   Hyperlipidemia   Weight Management Screening    Discussed the use of AI scribe software for clinical note transcription with the patient, who gave verbal consent to proceed.  History of Present Illness   Wt Readings from Last 3 Encounters:  02/17/24 148 lb 3.2 oz (67.2 kg)  12/25/23 145 lb (65.8 kg)  11/18/23 148 lb 6.4 oz (67.3 kg)      Relevant past medical, surgical, family and social history reviewed and updated as indicated. Interim medical history since our last visit reviewed. Allergies and medications reviewed and updated.  ROS per HPI unless specifically indicated above     Objective:    BP 116/77   Pulse 80   Temp 98.2 F (36.8 C) (Oral)   Ht 5' 1 (1.549 m)   Wt 148 lb 3.2 oz (67.2 kg)   LMP 02/08/2024 (Approximate)   SpO2 98%   BMI 28.00 kg/m   Wt Readings from Last 3 Encounters:  02/17/24 148 lb 3.2 oz (67.2 kg)  12/25/23 145 lb (65.8 kg)  11/18/23 148 lb 6.4 oz (67.3 kg)     Physical Exam      02/17/2024    1:08 PM 11/26/2023   10:54 AM 11/18/2023    1:47 PM 10/21/2023    3:02 PM 05/27/2023    4:27 PM  Depression screen PHQ 2/9  Decreased Interest 0 0 0 0 1  Down, Depressed, Hopeless 1 0 0 2 0  PHQ - 2 Score 1 0 0 2 1  Altered sleeping 2  2 2 2   Tired, decreased energy 3  3 0 3  Change in appetite 0  2 0 3  Feeling bad or failure about yourself  0  0 0 0  Trouble concentrating 1  1 1 1   Moving slowly or fidgety/restless 0  0 0 0  Suicidal thoughts 0  0 0 0  PHQ-9 Score 7  8  5  10    Difficult doing work/chores Not difficult at all  Not difficult at all  Not difficult at all      Data saved with a previous flowsheet row definition       02/17/2024    1:09 PM 11/26/2023   10:54 AM 11/18/2023    1:48 PM 10/21/2023    3:03 PM  GAD 7 : Generalized Anxiety Score  Nervous, Anxious, on Edge 2 1 2 2   Control/stop worrying 2 1 2 2   Worry too much - different things 2 1 2 2   Trouble relaxing 2 1 3 2   Restless 0 0 0 0  Easily annoyed or irritable 0 0 0 0  Afraid - awful might happen 2 0 1 1  Total GAD 7 Score 10 4 10 9   Anxiety Difficulty Not difficult at all  Not difficult at all Not difficult at all       Assessment & Plan:  Assessment & Plan   Bronchitis -  methylPREDNISolone ; Follow instructions in the packaging.  Dispense: 21 each; Refill: 0 -     Albuterol  Sulfate HFA; Inhale 2 puffs into the lungs every 6 (six) hours as needed for wheezing or shortness of breath.  Dispense: 8 g; Refill: 0  Perimenopause -     Gabapentin ; Take 1 capsule (100 mg total) by mouth 2 (two) times daily as needed.  Dispense: 60 capsule; Refill: 3 -     Ambulatory referral to Gynecology     Assessment and Plan Assessment & Plan      Follow up plan: No follow-ups on file.  Cristina SHAUNNA Nett, MD

## 2024-02-20 ENCOUNTER — Encounter: Payer: Self-pay | Admitting: Pediatrics

## 2024-02-20 NOTE — Assessment & Plan Note (Signed)
 Severe anxiety and mood symptoms likely related to perimenopausal changes. Paxil  discontinued, hydroxyzine not tolerated. Gabapentin  considered to minimize sedation risk. Discussed potential hormonal therapy with gynecology referral. - Prescribed low-dose gabapentin . - Referred to gynecology for evaluation and potential hormonal therapy. - Advised to take gabapentin  at night initially to monitor for sedation.

## 2024-02-20 NOTE — Assessment & Plan Note (Signed)
 Wegovy  effective for weight loss. Temporary discontinuation due to respiratory symptoms. Plan to resume with dose adjustments. Insurance coverage confirmed for one year. - Prescribed Wegovy  with dose adjustments, including 0.5 mg and 1 mg doses with refills. - Advised to resume Wegovy  once respiratory symptoms improve.

## 2024-02-22 ENCOUNTER — Encounter: Admitting: Family Medicine

## 2024-02-22 ENCOUNTER — Telehealth: Payer: Self-pay

## 2024-02-22 NOTE — Telephone Encounter (Signed)
 Voice mail box is full; Sent Mychart message: Your scheduled appointment for today 02/22/2024 has been cancelled. The provider is out of the office today.  Please call [phone] to reschedule.

## 2024-02-26 ENCOUNTER — Encounter: Admitting: Family Medicine

## 2024-03-14 ENCOUNTER — Encounter: Admitting: Family Medicine

## 2024-03-14 ENCOUNTER — Encounter: Payer: Self-pay | Admitting: Family Medicine

## 2024-03-14 VITALS — BP 140/78 | HR 77 | Resp 16 | Ht 61.0 in | Wt 144.0 lb

## 2024-03-14 DIAGNOSIS — I10 Essential (primary) hypertension: Secondary | ICD-10-CM | POA: Diagnosis not present

## 2024-03-14 DIAGNOSIS — R87612 Low grade squamous intraepithelial lesion on cytologic smear of cervix (LGSIL): Secondary | ICD-10-CM

## 2024-03-14 DIAGNOSIS — R002 Palpitations: Secondary | ICD-10-CM | POA: Diagnosis not present

## 2024-03-14 DIAGNOSIS — E7800 Pure hypercholesterolemia, unspecified: Secondary | ICD-10-CM

## 2024-03-14 DIAGNOSIS — N951 Menopausal and female climacteric states: Secondary | ICD-10-CM

## 2024-03-14 DIAGNOSIS — Z7689 Persons encountering health services in other specified circumstances: Secondary | ICD-10-CM

## 2024-03-14 DIAGNOSIS — F411 Generalized anxiety disorder: Secondary | ICD-10-CM | POA: Diagnosis not present

## 2024-03-14 MED ORDER — PROPRANOLOL HCL 10 MG PO TABS: 10.0000 mg | ORAL_TABLET | Freq: Two times a day (BID) | ORAL | 2 refills | Status: AC | PRN

## 2024-03-14 NOTE — Patient Instructions (Signed)

## 2024-03-14 NOTE — Progress Notes (Signed)
 "  New Patient Office Visit  Subjective   Patient ID: Cristina Aguilar, female    DOB: Feb 08, 1971  Age: 54 y.o. MRN: [id number]  CC:  Chief Complaint  Patient presents with   Transitions Of Care   Hand Pain    Cut finger weeks ago worried metal is inside after it healed.   Hypertension    Elevated BP feels worse when on cycle thinks it may be menopausal   Discussed the use of AI scribe software for clinical note transcription with the patient, who gave verbal consent to proceed.  History of Present Illness   Cristina Aguilar is a 54 year old female with hypertension, hyperlipidemia, and anxiety/depression who presents to establish with [facility] at [facility]. She reports she is in perimenopause and is noticing an increase in anxiety and blood pressure fluctuations.  She experiences significant anxiety, particularly around her menstrual cycle, which she associates with perimenopausal symptoms. Her anxiety is described as 'almost paralyzing,' affecting her ability to engage in social activities. She experiences hot flashes and heart racing during these episodes. She is not currently on any medication specifically for anxiety due to sensitivity and adverse reactions to past medications, including Buspar , Xanax, and Klonopin. 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 She has undergone genetic testing for medication sensitivity with her previous PCP.  Her blood pressure tends to rise right before her menstrual cycle and during periods of increased anxiety, with a recent reading of 140 mmHg. She is not on any specific medication for anxiety but has been prescribed gabapentin , which she has not taken due to concerns about dependency.  She has a history of hyperlipidemia and takes her cholesterol medication irregularly, approximately three to four times a week, due to muscle-related side effects when taken daily. Her cholesterol levels were last checked in August.  She has  a history of migraines, which have resolved and are no longer a concern. She also experiences restless leg syndrome primarily around her menstrual cycle but does not take medication for it.  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 She has no personal or family history of ovarian, vaginal, cervical, or breast cancer.  She has not taken her Wegovy  shot in a few weeks due to concerns it might be contributing to her anxiety, but she does not believe it is the cause as her symptoms persisted without it.        03/14/2024    2:02 PM 02/17/2024    1:09 PM 11/26/2023   10:54 AM 11/18/2023    1:48 PM  GAD 7 : Generalized Anxiety Score  Nervous, Anxious, on Edge 2 2 1 2   Control/stop worrying 2 2 1 2   Worry too much - different things 2 2 1 2   Trouble relaxing 1 2 1 3   Restless 0 0 0 0  Easily annoyed or irritable 0 0 0 0  Afraid - awful might happen 2 2 0 1  Total GAD 7 Score 9 10 4 10   Anxiety Difficulty  Not difficult at all  Not difficult at all      03/14/2024    2:02 PM 02/17/2024    1:08 PM 11/26/2023   10:54 AM  PHQ9 SCORE ONLY  PHQ-9 Total Score 5 7 0   Outpatient Encounter Medications as of 03/14/2024  Medication Sig   albuterol  (VENTOLIN  HFA) 108 (90 Base) MCG/ACT inhaler Inhale 2 puffs into the lungs every  6 (six) hours as needed for wheezing or shortness of breath.   atorvastatin  (LIPITOR) 10 MG tablet Take 1 tablet (10 mg total) by mouth daily.   cetirizine  (ZYRTEC ) 10 MG tablet Take 1 tablet (10 mg total) by mouth daily.   fluticasone  (FLONASE ) 50 MCG/ACT nasal spray Place 2 sprays into both nostrils daily.   propranolol  (INDERAL ) 10 MG tablet Take 1 tablet (10 mg total) by mouth 2 (two) times daily as needed.   semaglutide -weight management (WEGOVY ) 0.25 MG/0.5ML SOAJ SQ injection Inject 0.25 mg into the skin once a week.   [DISCONTINUED] gabapentin  (NEURONTIN ) 100 MG capsule Take 1 capsule (100 mg total) by mouth 2 (two) times  daily as needed.   [DISCONTINUED] trimethoprim -polymyxin b  (POLYTRIM ) ophthalmic solution 1-2 drops to affected eye every 4-6  hours for 7 days   [DISCONTINUED] methylPREDNISolone  (MEDROL  DOSEPAK) 4 MG TBPK tablet Follow instructions in the packaging. (Patient not taking: Reported on 03/14/2024)   [DISCONTINUED] permethrin  (ELIMITE ) 5 % cream Apply to all areas of the body from the neck to soles of your feet; leave on for 8 to 14 hours before removing by washing off in either shower of bath.  May repeat in 14 days if living mites or bite marks are observed. (Patient not taking: Reported on 03/14/2024)   No facility-administered encounter medications on file as of 03/14/2024.    Patient Active Problem List   Diagnosis Date Noted   Varicose veins of both lower extremities with pain 12/25/2023   Superficial thrombophlebitis 12/25/2023   Scabies 11/26/2023   BMI 28.0-28.9,adult 11/25/2023   Depression with anxiety 10/20/2023   Hypertension    Hyperlipidemia    GERD (gastroesophageal reflux disease)    Perimenopause 06/02/2023   Seasonal allergies 06/02/2023   Prediabetes 05/29/2023   Mild CAD 09/26/2022   Former smoker 09/24/2022   RLS (restless legs syndrome) 09/24/2022   Heart palpitations 04/17/2021   Hypercholesterolemia 03/28/2020   Migraine without aura and without status migrainosus, not intractable 11/15/2014   Past Medical History:  Diagnosis Date   Allergy    Seasonal   Anxiety    Anxiety attack 07/19/2015   30 xanax q six months okay per pcp.     Family history of adverse reaction to anesthesia    Maternal aunt - slow to wake   GERD (gastroesophageal reflux disease)    Hyperlipidemia    Hypertension    Migraine headache    2-3 per year   Restless leg syndrome    Sciatica 10/21/2023   Past Surgical History:  Procedure Laterality Date   HERNIA REPAIR     TONSILLECTOMY     TUBAL LIGATION     Family History  Problem Relation Age of Onset   Heart disease Mother     Anxiety disorder Mother    Early death Mother    Cancer Father    Breast cancer Maternal Grandmother        mat great gm   Cancer Maternal Grandmother    Alcohol abuse Sister    Depression Sister    Asthma Maternal Aunt    Social History   Socioeconomic History   Marital status: Single    Spouse name: Not on file   Number of children: 2   Years of education: Not on file   Highest education level: GED or equivalent  Occupational History   Not on file  Tobacco Use   Smoking status: Former    Current packs/day: 1.00    Average packs/day:  1 pack/day for 17.6 years (17.6 ttl pk-yrs)    Types: Cigarettes    Start date: 03/10/1988   Smokeless tobacco: Never  Vaping Use   Vaping status: Never Used  Substance and Sexual Activity   Alcohol use: No   Drug use: Never   Sexual activity: Yes  Other Topics Concern   Not on file  Social History Narrative   Not on file   Social Drivers of Health   Tobacco Use: Medium Risk (03/14/2024)   Patient History    Smoking Tobacco Use: Former    Smokeless Tobacco Use: Never    Passive Exposure: Not on Actuary Strain: Low Risk (02/17/2024)   Overall Financial Resource Strain (CARDIA)    Difficulty of Paying Living Expenses: Not very hard  Food Insecurity: No Food Insecurity (02/17/2024)   Epic    Worried About Programme Researcher, Broadcasting/film/video in the Last Year: Never true    Ran Out of Food in the Last Year: Never true  Transportation Needs: No Transportation Needs (02/17/2024)   Epic    Lack of Transportation (Medical): No    Lack of Transportation (Non-Medical): No  Physical Activity: Sufficiently Active (02/17/2024)   Exercise Vital Sign    Days of Exercise per Week: 4 days    Minutes of Exercise per Session: 120 min  Stress: Stress Concern Present (02/17/2024)   [employer] of Occupational Health - Occupational Stress Questionnaire    Feeling of Stress: To some extent  Social Connections: Moderately Integrated (02/17/2024)    Social Connection and Isolation Panel    Frequency of Communication with Friends and Family: More than three times a week    Frequency of Social Gatherings with Friends and Family: More than three times a week    Attends Religious Services: More than 4 times per year    Active Member of Clubs or Organizations: No    Attends Engineer, Structural: Not on file    Marital Status: Living with partner  Intimate Partner Violence: Not on file  Depression (PHQ2-9): Medium Risk (03/14/2024)   Depression (PHQ2-9)    PHQ-2 Score: 5  Alcohol Screen: Low Risk (02/17/2024)   Alcohol Screen    Last Alcohol Screening Score (AUDIT): 1  Housing: Low Risk (02/17/2024)   Epic    Unable to Pay for Housing in the Last Year: No    Number of Times Moved in the Last Year: 0    Homeless in the Last Year: No  Utilities: Not on file  Health Literacy: Not on file   Outpatient Medications Prior to Visit  Medication Sig Dispense Refill   albuterol  (VENTOLIN  HFA) 108 (90 Base) MCG/ACT inhaler Inhale 2 puffs into the lungs every 6 (six) hours as needed for wheezing or shortness of breath. 8 g 0   atorvastatin  (LIPITOR) 10 MG tablet Take 1 tablet (10 mg total) by mouth daily. 30 tablet 1   cetirizine  (ZYRTEC ) 10 MG tablet Take 1 tablet (10 mg total) by mouth daily. 30 tablet 3   fluticasone  (FLONASE ) 50 MCG/ACT nasal spray Place 2 sprays into both nostrils daily. 16 g 6   semaglutide -weight management (WEGOVY ) 0.25 MG/0.5ML SOAJ SQ injection Inject 0.25 mg into the skin once a week. 2 mL 3   gabapentin  (NEURONTIN ) 100 MG capsule Take 1 capsule (100 mg total) by mouth 2 (two) times daily as needed. 60 capsule 3   trimethoprim -polymyxin b  (POLYTRIM ) ophthalmic solution 1-2 drops to affected eye every 4-6  hours  for 7 days 10 mL 0   methylPREDNISolone  (MEDROL  DOSEPAK) 4 MG TBPK tablet Follow instructions in the packaging. (Patient not taking: Reported on 03/14/2024) 21 each 0   permethrin  (ELIMITE ) 5 % cream Apply  to all areas of the body from the neck to soles of your feet; leave on for 8 to 14 hours before removing by washing off in either shower of bath.  May repeat in 14 days if living mites or bite marks are observed. (Patient not taking: Reported on 03/14/2024) 60 g 2   No facility-administered medications prior to visit.   Allergies[1]  ROS: see HPI    Objective   Today's Vitals   03/14/24 1402 03/14/24 1441  BP: (!) 140/78 (!) 140/78  Pulse: 77   Resp: 16   SpO2: 98%   Weight: 144 lb (65.3 kg)   Height: 5' 1 (1.549 m)   PainSc: 0-No pain    GENERAL: Well-appearing, in NAD. Well nourished.  SKIN: Pink, warm and dry. No rash, lesion, ulceration, or ecchymoses.  Head: Normocephalic. NECK: Trachea midline. Full ROM w/o pain or tenderness. No lymphadenopathy.  EARS: Tympanic membranes are intact, translucent without bulging and without drainage. Appropriate landmarks visualized.  EYES: Conjunctiva clear without exudates. EOMI, PERRL, no drainage present.  NOSE: Septum midline w/o deformity. Nares patent, mucosa pink and non-inflamed w/o drainage. No sinus tenderness.  THROAT: Uvula midline. Oropharynx clear. Tonsils non-inflamed without exudate. Mucous membranes pink and moist.  RESPIRATORY: Chest wall symmetrical. Respirations even and non-labored. Breath sounds clear to auscultation bilaterally.  CARDIAC: S1, S2 present, regular rate and rhythm without murmur or gallops. Peripheral pulses 2+ bilaterally.  MSK: Muscle tone and strength appropriate for age. Joints w/o tenderness, redness, or swelling.  EXTREMITIES: Without clubbing, cyanosis, or edema.  NEUROLOGIC: No motor or sensory deficits. Steady, even gait. C2-C12 intact.  PSYCH/MENTAL STATUS: Alert, oriented x 3. Cooperative, appropriate mood and affect.    Assessment & Plan:   1. Encounter to establish care (Primary) Patient is a 22- year-old female who presents today to establish care with primary care at [facility]. Reviewed  the past medical history, family history, social history, surgical history, medications and allergies today- updates made as indicated. Patient has concerns today about perimenopausal symptoms with increased anxiety and heart palpitations.   2. Primary hypertension Patient in no acute distress and is well-appearing. Slightly elevated systolic blood pressure. Denies chest pain, shortness of breath, lower extremity edema, vision changes, headaches. Cardiovascular exam with heart regular rate and rhythm. Normal heart sounds, no murmurs present. No lower extremity edema present. Lungs clear to auscultation bilaterally. Advised patient to continue to closely monitor blood pressure as we change his dose. Return to office sooner if blood pressure begins to increase greater than 130/80.  3. Heart palpitations History of 14-day heart monitor with normal results. Rare supraventricular and ventricular ectopy. No sustained dysrhythmias or abnormalities.  - propranolol  (INDERAL ) 10 MG tablet; Take 1 tablet (10 mg total) by mouth 2 (two) times daily as needed.  Dispense: 30 tablet; Refill: 2  4. Perimenopause Anxiety exacerbated by menstrual periods, causing heart palpitations and hypertension. Sensitive to medications, including Xanax and Buspar . Effexor considered for mood and menopausal symptoms. Beta blockers considered for acute anxiety. Prescribed propranolol  10 mg tablet as needed for anxiety-related symptoms, with option to cut in half if needed. Will contact Dr. Herold to obtain genetic testing results for medication sensitivity. Will consider Effexor for anxiety and menopausal symptoms if genetic testing supports its use.  5. GAD (generalized anxiety disorder) See #4 - propranolol  (INDERAL ) 10 MG tablet; Take 1 tablet (10 mg total) by mouth 2 (two) times daily as needed.  Dispense: 30 tablet; Refill: 2  6. Hypercholesterolemia Managed with intermittent statin use due to muscle side effects. Last  cholesterol check in August. Inconsistent medication adherence. Will schedule follow-up appointment in mid-February to check fasting cholesterol levels. Will discuss potential medication adjustments based on cholesterol results.  7. LGSIL on Pap smear of cervix Low-grade squamous intraepithelial lesion noted on Pap smear. HPV test negative. Continue with plan to recheck Pap smear in one year.      Return in about 6 weeks (around 04/22/2024) for Mood f/u (w/ fasting lipid panel).   Evalene Arts, FNP     [1]  Allergies Allergen Reactions   Codeine Shortness Of Breath   Morphine And Codeine Other (See Comments)    Seizures    Sulfa Antibiotics Rash   "

## 2024-04-15 ENCOUNTER — Ambulatory Visit: Payer: Self-pay

## 2024-04-15 NOTE — Telephone Encounter (Signed)
 FYI Only or Action Required?: FYI only for provider: appointment scheduled on 04/18/24.  Patient was last seen in primary care on 03/14/2024 by Towana Small, FNP.  Called Nurse Triage reporting Anxiety.  Symptoms began several days ago.  Interventions attempted: Nothing.  Symptoms are: stable.  Triage Disposition: See PCP When Office is Open (Within 3 Days)  Patient/caregiver understands and will follow disposition?: Yes   Reason for Disposition  MODERATE anxiety (e.g., persistent or frequent anxiety symptoms; interferes with sleep, school, or work)  Answer Assessment - Initial Assessment Questions Pt reports noticing BP is high when experiencing this. Wore a holter monitor in January for heart palpitations, prescribed  propranolol  (INDERAL ) 10 MG tablet; Take 1 tablet (10 mg total) by mouth 2 (two) times daily as needed, has not been taking it, picked it up today. Advised patient to take it as prescribed. Pt concerned it will drop her BP too much, are there any BP or HR for her to not take the medication.   1. CONCERN: Did anything happen that prompted you to call today?      Tightness below the breast area in rib cage, comes and goes, happens during the day doing things, or just relaxing.   2. ANXIETY SYMPTOMS: Can you describe how you (your loved one; patient) have been feeling? (e.g., tense, restless, panicky, anxious, keyed up, overwhelmed, sense of impending doom).      Tense and anxious  3. ONSET: How long have you been feeling this way? (e.g., hours, days, weeks)     4 days ago  4. OTHER SYMPTOMS: Do you have any other symptoms? (e.g., feeling depressed, trouble concentrating, trouble sleeping, trouble breathing, palpitations or fast heartbeat, chest pain, sweating, nausea, or diarrhea)       Denies chest pain, SOB. Reports mild lightheadedness when experiencing the chest tightness, reports being an over thinker and gets lightheaded a lot.  Protocols used: Anxiety  and Panic Attack-A-AH  Message from Alfonso ORN sent at 04/15/2024  4:18 PM EST  Reason for Triage: stomach area feels tight, anxiety

## 2024-04-18 ENCOUNTER — Ambulatory Visit: Admitting: Family Medicine

## 2024-08-16 ENCOUNTER — Encounter: Admitting: Nurse Practitioner
